# Patient Record
Sex: Female | Born: 1990 | Race: White | Hispanic: No | Marital: Married | Smoking: Former smoker
Health system: Southern US, Community
[De-identification: ages and names within clinical notes are randomized; demographics above are authoritative.]

## PROBLEM LIST (undated history)

## (undated) DIAGNOSIS — R569 Unspecified convulsions: Secondary | ICD-10-CM

## (undated) DIAGNOSIS — IMO0001 Reserved for inherently not codable concepts without codable children: Secondary | ICD-10-CM

## (undated) DIAGNOSIS — F1010 Alcohol abuse, uncomplicated: Secondary | ICD-10-CM

## (undated) DIAGNOSIS — F32A Depression, unspecified: Secondary | ICD-10-CM

## (undated) DIAGNOSIS — F1110 Opioid abuse, uncomplicated: Secondary | ICD-10-CM

## (undated) DIAGNOSIS — F419 Anxiety disorder, unspecified: Secondary | ICD-10-CM

## (undated) DIAGNOSIS — I639 Cerebral infarction, unspecified: Secondary | ICD-10-CM

## (undated) DIAGNOSIS — B1920 Unspecified viral hepatitis C without hepatic coma: Secondary | ICD-10-CM

## (undated) DIAGNOSIS — Q219 Congenital malformation of cardiac septum, unspecified: Secondary | ICD-10-CM

## (undated) HISTORY — PX: WRIST SURGERY: SHX841

## (undated) HISTORY — DX: Anxiety disorder, unspecified: F41.9

## (undated) HISTORY — DX: Depression, unspecified: F32.A

## (undated) HISTORY — PX: ADENOIDECTOMY: SUR15

---
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
## 2010-05-17 ENCOUNTER — Emergency Department (HOSPITAL_COMMUNITY)
Admission: EM | Admit: 2010-05-17 | Discharge: 2010-05-17 | Payer: Self-pay | Source: Home / Self Care | Admitting: Emergency Medicine

## 2010-08-03 ENCOUNTER — Emergency Department: Payer: Self-pay | Admitting: Emergency Medicine

## 2011-09-15 LAB — CBC
RBC: 4.7 10*6/uL (ref 3.80–5.20)
WBC: 8.4 10*3/uL (ref 3.6–11.0)

## 2011-09-15 LAB — COMPREHENSIVE METABOLIC PANEL
Anion Gap: 9 (ref 7–16)
Bilirubin,Total: 0.3 mg/dL (ref 0.2–1.0)
Chloride: 107 mmol/L (ref 98–107)
Co2: 25 mmol/L (ref 21–32)
Osmolality: 278 (ref 275–301)
Potassium: 3.6 mmol/L (ref 3.5–5.1)
SGOT(AST): 24 U/L (ref 15–37)
SGPT (ALT): 35 U/L

## 2011-09-15 LAB — DRUG SCREEN, URINE
Amphetamines, Ur Screen: NEGATIVE (ref ?–1000)
Benzodiazepine, Ur Scrn: POSITIVE (ref ?–200)
MDMA (Ecstasy)Ur Screen: NEGATIVE (ref ?–500)
Opiate, Ur Screen: NEGATIVE (ref ?–300)
Phencyclidine (PCP) Ur S: NEGATIVE (ref ?–25)
Tricyclic, Ur Screen: NEGATIVE (ref ?–1000)

## 2011-09-15 LAB — TSH: Thyroid Stimulating Horm: 0.44 u[IU]/mL — ABNORMAL LOW

## 2011-09-15 LAB — CBC WITH DIFFERENTIAL/PLATELET
Eosinophil #: 0.4 10*3/uL (ref 0.0–0.7)
Eosinophil %: 4.4 %
Lymphocyte #: 2.6 10*3/uL (ref 1.0–3.6)
Monocyte %: 5.3 %
Neutrophil #: 4.9 10*3/uL (ref 1.4–6.5)

## 2011-09-15 LAB — SALICYLATE LEVEL: Salicylates, Serum: 2.3 mg/dL

## 2011-09-15 LAB — ETHANOL: Ethanol %: 0.182 % — ABNORMAL HIGH (ref 0.000–0.080)

## 2011-09-15 LAB — PREGNANCY, URINE: Pregnancy Test, Urine: NEGATIVE m[IU]/mL

## 2011-09-16 ENCOUNTER — Inpatient Hospital Stay: Payer: Self-pay | Admitting: Psychiatry

## 2011-11-29 ENCOUNTER — Emergency Department: Payer: Self-pay | Admitting: Unknown Physician Specialty

## 2012-04-15 ENCOUNTER — Emergency Department: Payer: Self-pay | Admitting: Emergency Medicine

## 2012-04-15 ENCOUNTER — Ambulatory Visit: Payer: Self-pay | Admitting: Orthopedic Surgery

## 2012-04-24 ENCOUNTER — Ambulatory Visit: Payer: Self-pay | Admitting: Orthopedic Surgery

## 2013-04-06 ENCOUNTER — Emergency Department: Payer: Self-pay | Admitting: Emergency Medicine

## 2013-04-06 LAB — URINALYSIS, COMPLETE
Bilirubin,UR: NEGATIVE
Blood: NEGATIVE
Leukocyte Esterase: NEGATIVE

## 2013-04-06 LAB — PREGNANCY, URINE: Pregnancy Test, Urine: NEGATIVE m[IU]/mL

## 2013-04-06 LAB — BASIC METABOLIC PANEL
BUN: 7 mg/dL (ref 7–18)
EGFR (African American): >60
EGFR (Non-African Amer.): >60
Glucose: 82 mg/dL (ref 65–99)
Potassium: 3.2 mmol/L — ABNORMAL LOW (ref 3.5–5.1)

## 2013-04-06 LAB — DRUG SCREEN, URINE
Amphetamines, Ur Screen: NEGATIVE (ref ?–1000)
Benzodiazepine, Ur Scrn: POSITIVE (ref ?–200)
Cannabinoid 50 Ng, Ur ~~LOC~~: NEGATIVE (ref ?–50)
Methadone, Ur Screen: NEGATIVE (ref ?–300)

## 2013-04-06 LAB — CBC WITH DIFFERENTIAL/PLATELET
Basophil %: 0.3 %
Eosinophil %: 5.5 %
Lymphocyte %: 28.6 %
Monocyte %: 4.8 %
RDW: 15.6 % — ABNORMAL HIGH (ref 11.5–14.5)
WBC: 7.6 10*3/uL (ref 3.6–11.0)

## 2013-04-06 LAB — MAGNESIUM: Magnesium: 2 mg/dL

## 2013-05-27 ENCOUNTER — Emergency Department: Payer: Self-pay | Admitting: Emergency Medicine

## 2013-05-27 LAB — URINALYSIS, COMPLETE
BILIRUBIN, UR: NEGATIVE
Glucose,UR: NEGATIVE mg/dL (ref 0–75)
KETONE: NEGATIVE
LEUKOCYTE ESTERASE: NEGATIVE
Nitrite: NEGATIVE
PH: 6 (ref 4.5–8.0)
PROTEIN: NEGATIVE
SPECIFIC GRAVITY: 1.003 (ref 1.003–1.030)

## 2013-05-27 LAB — CBC
HCT: 40.9 % (ref 35.0–47.0)
HGB: 13.4 g/dL (ref 12.0–16.0)
MCH: 28.6 pg (ref 26.0–34.0)
MCHC: 32.9 g/dL (ref 32.0–36.0)
MCV: 87 fL (ref 80–100)
PLATELETS: 369 10*3/uL (ref 150–440)
RBC: 4.7 10*6/uL (ref 3.80–5.20)
RDW: 15.5 % — AB (ref 11.5–14.5)
WBC: 12.8 10*3/uL — AB (ref 3.6–11.0)

## 2013-05-27 LAB — ETHANOL
Ethanol %: 0.239 % — ABNORMAL HIGH (ref 0.000–0.080)
Ethanol: 239 mg/dL

## 2013-05-27 LAB — COMPREHENSIVE METABOLIC PANEL
ALT: 62 U/L (ref 12–78)
ANION GAP: 4 — AB (ref 7–16)
Albumin: 4 g/dL (ref 3.4–5.0)
Alkaline Phosphatase: 81 U/L
BUN: 10 mg/dL (ref 7–18)
Bilirubin,Total: 0.2 mg/dL (ref 0.2–1.0)
CALCIUM: 8.9 mg/dL (ref 8.5–10.1)
CREATININE: 0.73 mg/dL (ref 0.60–1.30)
Chloride: 108 mmol/L — ABNORMAL HIGH (ref 98–107)
Co2: 28 mmol/L (ref 21–32)
EGFR (African American): >60
EGFR (Non-African Amer.): >60
GLUCOSE: 94 mg/dL (ref 65–99)
OSMOLALITY: 278 (ref 275–301)
Potassium: 3.4 mmol/L — ABNORMAL LOW (ref 3.5–5.1)
SGOT(AST): 146 U/L — ABNORMAL HIGH (ref 15–37)
SODIUM: 140 mmol/L (ref 136–145)
TOTAL PROTEIN: 8.2 g/dL (ref 6.4–8.2)

## 2013-11-09 ENCOUNTER — Emergency Department: Payer: Self-pay | Admitting: Emergency Medicine

## 2013-11-09 LAB — COMPREHENSIVE METABOLIC PANEL
ALBUMIN: 4.3 g/dL (ref 3.4–5.0)
ALK PHOS: 77 U/L
ANION GAP: 13 (ref 7–16)
AST: 23 U/L (ref 15–37)
BILIRUBIN TOTAL: 0.5 mg/dL (ref 0.2–1.0)
BUN: 6 mg/dL — ABNORMAL LOW (ref 7–18)
CHLORIDE: 107 mmol/L (ref 98–107)
CREATININE: 0.83 mg/dL (ref 0.60–1.30)
Calcium, Total: 8.7 mg/dL (ref 8.5–10.1)
Co2: 22 mmol/L (ref 21–32)
EGFR (Non-African Amer.): >60
GLUCOSE: 78 mg/dL (ref 65–99)
OSMOLALITY: 280 (ref 275–301)
POTASSIUM: 2.9 mmol/L — AB (ref 3.5–5.1)
SGPT (ALT): 17 U/L
SODIUM: 142 mmol/L (ref 136–145)
Total Protein: 8.7 g/dL — ABNORMAL HIGH (ref 6.4–8.2)

## 2013-11-09 LAB — CBC
HCT: 44.4 % (ref 35.0–47.0)
HGB: 14.6 g/dL (ref 12.0–16.0)
MCH: 29.6 pg (ref 26.0–34.0)
MCHC: 32.8 g/dL (ref 32.0–36.0)
MCV: 90 fL (ref 80–100)
PLATELETS: 390 10*3/uL (ref 150–440)
RBC: 4.91 10*6/uL (ref 3.80–5.20)
RDW: 13.6 % (ref 11.5–14.5)
WBC: 9.9 10*3/uL (ref 3.6–11.0)

## 2013-11-09 LAB — URINALYSIS, COMPLETE
BACTERIA: NONE SEEN
BILIRUBIN, UR: NEGATIVE
GLUCOSE, UR: NEGATIVE mg/dL (ref 0–75)
KETONE: NEGATIVE
Leukocyte Esterase: NEGATIVE
Nitrite: NEGATIVE
PH: 6 (ref 4.5–8.0)
Protein: NEGATIVE
SPECIFIC GRAVITY: 1.009 (ref 1.003–1.030)

## 2013-11-09 LAB — DRUG SCREEN, URINE
Amphetamines, Ur Screen: NEGATIVE (ref ?–1000)
BENZODIAZEPINE, UR SCRN: NEGATIVE (ref ?–200)
Barbiturates, Ur Screen: NEGATIVE (ref ?–200)
CANNABINOID 50 NG, UR ~~LOC~~: NEGATIVE (ref ?–50)
COCAINE METABOLITE, UR ~~LOC~~: POSITIVE (ref ?–300)
MDMA (Ecstasy)Ur Screen: NEGATIVE (ref ?–500)
METHADONE, UR SCREEN: NEGATIVE (ref ?–300)
OPIATE, UR SCREEN: NEGATIVE (ref ?–300)
Phencyclidine (PCP) Ur S: NEGATIVE (ref ?–25)
Tricyclic, Ur Screen: NEGATIVE (ref ?–1000)

## 2013-11-09 LAB — ACETAMINOPHEN LEVEL: Acetaminophen: <2 ug/mL

## 2013-11-09 LAB — ETHANOL
ETHANOL %: 0.195 % — AB (ref 0.000–0.080)
Ethanol: 195 mg/dL

## 2013-11-09 LAB — TSH: THYROID STIMULATING HORM: 0.5 u[IU]/mL

## 2013-11-09 LAB — HEMOGLOBIN
HGB: 10.7 g/dL — ABNORMAL LOW (ref 12.0–16.0)
HGB: 10.4 g/dL — AB (ref 12.0–16.0)

## 2013-11-09 LAB — SALICYLATE LEVEL: Salicylates, Serum: 4.2 mg/dL — ABNORMAL HIGH

## 2013-11-09 LAB — PREGNANCY, URINE: PREGNANCY TEST, URINE: NEGATIVE m[IU]/mL

## 2013-11-19 DIAGNOSIS — F419 Anxiety disorder, unspecified: Secondary | ICD-10-CM | POA: Insufficient documentation

## 2013-11-19 DIAGNOSIS — F32A Depression, unspecified: Secondary | ICD-10-CM | POA: Insufficient documentation

## 2013-11-19 DIAGNOSIS — F329 Major depressive disorder, single episode, unspecified: Secondary | ICD-10-CM | POA: Insufficient documentation

## 2014-07-04 ENCOUNTER — Encounter (HOSPITAL_COMMUNITY): Payer: Self-pay | Admitting: Emergency Medicine

## 2014-07-04 ENCOUNTER — Emergency Department (HOSPITAL_COMMUNITY)
Admission: EM | Admit: 2014-07-04 | Discharge: 2014-07-04 | Disposition: A | Discharge status: Home / Self Care | Payer: Self-pay | Attending: Emergency Medicine | Admitting: Emergency Medicine

## 2014-07-04 DIAGNOSIS — Y9289 Other specified places as the place of occurrence of the external cause: Secondary | ICD-10-CM | POA: Insufficient documentation

## 2014-07-04 DIAGNOSIS — Z8673 Personal history of transient ischemic attack (TIA), and cerebral infarction without residual deficits: Secondary | ICD-10-CM | POA: Insufficient documentation

## 2014-07-04 DIAGNOSIS — T401X1A Poisoning by heroin, accidental (unintentional), initial encounter: Secondary | ICD-10-CM | POA: Insufficient documentation

## 2014-07-04 DIAGNOSIS — Y9389 Activity, other specified: Secondary | ICD-10-CM | POA: Insufficient documentation

## 2014-07-04 DIAGNOSIS — Z8669 Personal history of other diseases of the nervous system and sense organs: Secondary | ICD-10-CM | POA: Insufficient documentation

## 2014-07-04 DIAGNOSIS — Z720 Tobacco use: Secondary | ICD-10-CM | POA: Insufficient documentation

## 2014-07-04 DIAGNOSIS — Y998 Other external cause status: Secondary | ICD-10-CM | POA: Insufficient documentation

## 2014-07-04 HISTORY — DX: Cerebral infarction, unspecified: I63.9

## 2014-07-04 HISTORY — DX: Unspecified convulsions: R56.9

## 2014-07-04 NOTE — ED Provider Notes (Signed)
CSN: [id number]     Arrival date & time 07/04/14  1540 History   First MD Initiated Contact with Patient 07/04/14 1547     Chief Complaint  Patient presents with  . Drug Overdose     (Consider location/radiation/quality/duration/timing/severity/associated sxs/prior Treatment) Patient is a 24 y.o. female presenting with Overdose. The history is provided by the patient.  Drug Overdose Pertinent negatives include no chest pain, no abdominal pain and no headaches.  pt with hx substance abuse, presents via ems after accidental overdose on heroin. Pt indicates was at a friends, they called ems who found pt apneic, unresponsive. They gave patient narcan 2 mg iv, w prompt return of responsiveness. Pt currently alert, content, talking w family, no c/o. Pt states was accidental, and was in no way trying to harm or kill self. She acknowledges hx of depression, but denies any acute or abrupt worsening. Denies other ingestion. Denies etoh abuse. States recent physical health at baseline, no acute illness, fevers, or other symptoms. Pt denies pain or injury. No nv.      Past Medical History  Diagnosis Date  . Stroke   . Seizures    Past Surgical History  Procedure Laterality Date  . Wrist surgery    . Adenoidectomy     No family history on file. History  Substance Use Topics  . Smoking status: Current Every Day Smoker  . Smokeless tobacco: Not on file  . Alcohol Use: Yes   OB History    No data available     Review of Systems  Constitutional: Negative for fever and chills.  HENT: Negative for sore throat.   Eyes: Negative for redness.  Respiratory: Negative for cough.   Cardiovascular: Negative for chest pain.  Gastrointestinal: Negative for vomiting, abdominal pain and diarrhea.  Genitourinary: Negative for flank pain.  Musculoskeletal: Negative for back pain and neck pain.  Skin: Negative for rash.  Neurological: Negative for weakness, numbness and headaches.  Hematological:  Does not bruise/bleed easily.  Psychiatric/Behavioral: Negative for suicidal ideas.      Allergies  Review of patient's allergies indicates no known allergies.  Home Medications   Prior to Admission medications   Not on File   BP 121/85 mmHg  Pulse 89  Temp(Src) 98.5 F (36.9 C) (Oral)  Resp 15  Ht 5\' 1"  (1.549 m)  SpO2 97%  LMP  (LMP Unknown) Physical Exam  Constitutional: She is oriented to person, place, and time. She appears well-developed and well-nourished. No distress.  HENT:  Head: Atraumatic.  Eyes: Conjunctivae are normal. Pupils are equal, round, and reactive to light. No scleral icterus.  Neck: Normal range of motion. Neck supple. No tracheal deviation present.  Cardiovascular: Normal rate, regular rhythm, normal heart sounds and intact distal pulses.   Pulmonary/Chest: Effort normal and breath sounds normal. No respiratory distress. She exhibits no tenderness.  Abdominal: Soft. Normal appearance and bowel sounds are normal. She exhibits no distension. There is no tenderness.  Musculoskeletal: Normal range of motion. She exhibits no edema or tenderness.  CTLS spine, non tender, aligned, no step off.   Neurological: She is alert and oriented to person, place, and time.  Motor intact bil. Using phone, talking w family.   Skin: Skin is warm and dry. No rash noted. She is not diaphoretic.  Psychiatric: She has a normal mood and affect.  Normal mood/affect, denies SI.   Nursing note and vitals reviewed.   ED Course  Procedures (including critical care time) Labs Review  EKG Interpretation   Date/Time:  Friday July 04 2014 15:49:28 EDT Ventricular Rate:  88 PR Interval:  154 QRS Duration: 94 QT Interval:  380 QTC Calculation: 460 R Axis:   83 Text Interpretation:  Sinus rhythm No previous tracing Confirmed by Denton Lank   MD, Caryn Bee ([id number]) on 07/04/2014 4:02:09 PM      MDM   Iv ns. Monitor. Pulse ox.  Pt denies any recent physical c/o.  Pt  denies feeling acutely depressed or having any thoughts of self harm, states overdose was accidental. She also states she is currently in an outpatient substance abuse program ?[facility], and has a counselor/therapist through that program whom she can/will follow up with.  Pt denies needing inpatient tx, detox or counseling.   Family here w pt.   Pt continues to deny any c/o.  Pulse ox 99%. No increased wob. No nv.  Reviewed nursing notes and prior charts for additional history.   Approximately 2 hours post arrival, pt asymptomatic. Pulse ox 99%. Vitals normal. No resp distress or dyspnea. No nv. No c/o pain or other c/o.  Pt requests d/c to home. Family w pt.     Cathren Laine, MD 07/04/14 1726

## 2014-07-04 NOTE — ED Notes (Signed)
Per [facility], pt from home, took a normal hit of herion ([place] white) and was found by friends in respiratory arrest, blue, no pulse. CPR was initiated by her friends for roughly 5 minutes, upon EMS arrival pt was alert, given 2mg  narcan. Upon arrival to ED, pt is AAOX4, in NAD texting on her phone. Pt c/o sternal pain. Pt has hx of stroke and seizures after coming off herion. Per ems, [place] white heroin can cause recurrent symptoms 25min to 2 hours after use.

## 2014-07-04 NOTE — Discharge Instructions (Signed)
It was our pleasure to provide your ER care today - we hope that you feel better.  Do not use heroin - heroin use can cause you to stop breathing, resulting in death.  Follow up with your outpatient substance abuse program in the next 1-2 days.  Also see resource guide provided for additional community resources.    Also follow up with counselor and primary care doctor in coming week.   Return to ER right away if worse, new symptoms, trouble breathing, fevers, other concern.     Accidental Overdose A drug overdose occurs when a chemical substance (drug or medication) is used in amounts large enough to overcome a person. This may result in severe illness or death. This is a type of poisoning. Accidental overdoses of medications or other substances come from a variety of reasons. When this happens accidentally, it is often because the person taking the substance does not know enough about what they have taken. Drugs which commonly cause overdose deaths are alcohol, psychotropic medications (medications which affect the mind), pain medications, illegal drugs (street drugs) such as cocaine and heroin, and multiple drugs taken at the same time. It may result from careless behavior (such as over-indulging at a party). Other causes of overdose may include multiple drug use, a lapse in memory, or drug use after a period of no drug use.  Sometimes overdosing occurs because a person cannot remember if they have taken their medication.  A common unintentional overdose in young children involves multi-vitamins containing iron. Iron is a part of the hemoglobin molecule in blood. It is used to transport oxygen to living cells. When taken in small amounts, iron allows the body to restock hemoglobin. In large amounts, it causes problems in the body. If this overdose is not treated, it can lead to death. Never take medicines that show signs of tampering or do not seem quite right. Never take medicines in the dark  or in poor lighting. Read the label and check each dose of medicine before you take it. When adults are poisoned, it happens most often through carelessness or lack of information. Taking medicines in the dark or taking medicine prescribed for someone else to treat the same type of problem is a dangerous practice. SYMPTOMS  Symptoms of overdose depend on the medication and amount taken. They can vary from over-activity with stimulant over-dosage, to sleepiness from depressants such as alcohol, narcotics and tranquilizers. Confusion, dizziness, nausea and vomiting may be present. If problems are severe enough coma and death may result. DIAGNOSIS  Diagnosis and management are generally straightforward if the drug is known. Otherwise it is more difficult. At times, certain symptoms and signs exhibited by the patient, or blood tests, can reveal the drug in question.  TREATMENT  In an emergency department, most patients can be treated with supportive measures. Antidotes may be available if there has been an overdose of opioids or benzodiazepines. A rapid improvement will often occur if this is the cause of overdose. At home or away from medical care:  There may be no immediate problems or warning signs in children.  Not everything works well in all cases of poisoning.  Take immediate action. Poisons may act quickly.  If you think someone has swallowed medicine or a household product, and the person is unconscious, having seizures (convulsions), or is not breathing, immediately call for an ambulance. IF a person is conscious and appears to be doing OK but has swallowed a poison:  Do not wait  to see what effect the poison will have. Immediately call a poison control center (listed in the white pages of your telephone book under "Poison Control" or inside the front cover with other emergency numbers). Some poison control centers have TTY capability for the deaf. Check with your local center if you or  someone in your family requires this service.  Keep the container so you can read the label on the product for ingredients.  Describe what, when, and how much was taken and the age and condition of the person poisoned. Inform them if the person is vomiting, choking, drowsy, shows a change in color or temperature of skin, is conscious or unconscious, or is convulsing.  Do not cause vomiting unless instructed by medical personnel. Do not induce vomiting or force liquids into a person who is convulsing, unconscious, or very drowsy. Stay calm and in control.   Activated charcoal also is sometimes used in certain types of poisoning and you may wish to add a supply to your emergency medicines. It is available without a prescription. Call a poison control center before using this medication. PREVENTION  Thousands of children die every year from unintentional poisoning. This may be from household chemicals, poisoning from carbon monoxide in a car, taking their parent's medications, or simply taking a few iron pills or vitamins with iron. Poisoning comes from unexpected sources.  Store medicines out of the sight and reach of children, preferably in a locked cabinet. Do not keep medications in a food cabinet. Always store your medicines in a secure place. Get rid of expired medications.  If you have children living with you or have them as occasional guests, you should have child-resistant caps on your medicine containers. Keep everything out of reach. Child proof your home.  If you are called to the telephone or to answer the door while you are taking a medicine, take the container with you or put the medicine out of the reach of small children.  Do not take your medication in front of children. Do not tell your child how good a medication is and how good it is for them. They may get the idea it is more of a treat.  If you are an adult and have accidentally taken an overdose, you need to consider how  this happened and what can be done to prevent it from happening again. If this was from a street drug or alcohol, determine if there is a problem that needs addressing. If you are not sure a problems exists, it is easy to talk to a professional and ask them if they think you have a problem. It is better to handle this problem in this way before it happens again and has a much worse consequence. Document Released: 06/18/2004 Document Revised: 06/27/2011 Document Reviewed: 11/24/2008 [facility] Patient Information 2015 [place], [place]. This information is not intended to replace advice given to you by your health care provider. Make sure you discuss any questions you have with your health care provider.      Narcotic Overdose A narcotic overdose is the misuse or overuse of a narcotic drug. A narcotic overdose can make you pass out and stop breathing. If you are not treated right away, this can cause permanent brain damage or stop your heart. Medicine may be given to reverse the effects of an overdose. If so, this medicine may bring on withdrawal symptoms. The symptoms may be abdominal cramps, throwing up (vomiting), sweating, chills, and nervousness. Injecting narcotics can cause  more problems than just an overdose. AIDS, hepatitis, and other very serious infections are transmitted by sharing needles and syringes. If you decide to quit using, there are medicines which can help you through the withdrawal period. Trying to quit all at once on your own can be uncomfortable, but not life-threatening. Call your caregiver, Narcotics Anonymous, or any drug and alcohol treatment program for further help.  Document Released: 05/12/2004 Document Revised: 06/27/2011 Document Reviewed: 03/06/2009 [facility] Patient Information 2015 [place], [place]. This information is not intended to replace advice given to you by your health care provider. Make sure you discuss any questions you have with your health care  provider.   Opioid Use Disorder Opioid use disorder is a mental disorder. It is the continued nonmedical use of opioids in spite of risks to health and well-being. Misused opioids include the street drug heroin. They also include pain medicines such as morphine, hydrocodone, oxycodone, and fentanyl. Opioids are very addictive. People who misuse opioids get an exaggerated feeling of well-being. Opioid use disorder often disrupts activities at home, work, or school. It may cause mental or physical problems.  A family history of opioid use disorder puts you at higher risk of it. People with opioid use disorder often misuse other drugs or have mental illness such as depression, posttraumatic stress disorder, or antisocial personality disorder. They also are at risk of suicide and death from overdose. SIGNS AND SYMPTOMS  Signs and symptoms of opioid use disorder include:  Use of opioids in larger amounts or over a longer period than intended.  Unsuccessful attempts to cut down or control opioid use.  A lot of time spent obtaining, using, or recovering from the effects of opioids.  A strong desire or urge to use opioids (craving).  Continued use of opioids in spite of major problems at work, school, or home because of use.  Continued use of opioids in spite of relationship problems because of use.  Giving up or cutting down on important life activities because of opioid use.  Use of opioids over and over in situations when it is physically hazardous, such as driving a car.  Continued use of opioids in spite of a physical problem that is likely related to use. Physical problems can include:  Severe constipation.  Poor nutrition.  Infertility.  Tuberculosis.  Aspiration pneumonia.  Infections such as human immunodeficiency virus (HIV) and hepatitis (from injecting opioids).  Continued use of opioids in spite of a mental problem that is likely related to use. Mental problems can  include:  Depression.  Anxiety.  Hallucinations.  Sleep problems.  Loss of sexual function.  Need to use more and more opioids to get the same effect, or lessened effect over time with use of the same amount (tolerance).  Having withdrawal symptoms when opioid use is stopped, or using opioids to reduce or avoid withdrawal symptoms. Withdrawal symptoms include:  Depressed, anxious, or irritable mood.  Nausea, vomiting, diarrhea, or intestinal cramping.  Muscle aches or spasms.  Excessive tearing or runny nose.  Dilated pupils, sweating, or hairs standing on end.  Yawning.  Fever, raised blood pressure, or fast pulse.  Restlessness or trouble sleeping. This does not apply to people taking opioids for medical reasons only. DIAGNOSIS Opioid use disorder is diagnosed by your health care provider. You may be asked questions about your opioid use and and how it affects your life. A physical exam may be done. A drug screen may be ordered. You may be referred to a  mental health professional. The diagnosis of opioid use disorder requires at least two symptoms within 12 months. The type of opioid use disorder you have depends on the number of signs and symptoms you have. The type may be:  Mild. Two or three signs and symptoms.   Moderate. Four or five signs and symptoms.   Severe. Six or more signs and symptoms. TREATMENT  Treatment is usually provided by mental health professionals with training in substance use disorders.The following options are available:  Detoxification.This is the first step in treatment for withdrawal. It is medically supervised withdrawal with the use of medicines. These medicines lessen withdrawal symptoms. They also raise the chance of becoming opioid free.  Counseling, also known as talk therapy. Talk therapy addresses the reasons you use opioids. It also addresses ways to keep you from using again (relapse). The goals of talk therapy are to avoid  relapse by:  Identifying and avoiding triggers for use.  Finding healthy ways to cope with stress.  Learning how to handle cravings.  Support groups. Support groups provide emotional support, advice, and guidance.  A medicine that blocks opioid receptors in your brain. This medicine can reduce opioid cravings that lead to relapse. This medicine also blocks the desired opioid effect when relapse occurs.  Opioids that are taken by mouth in place of the misused opioid (opioid maintenance treatment). These medicines satisfy cravings but are safer than commonly misused opioids. This often is the best option for people who continue to relapse with other treatments. HOME CARE INSTRUCTIONS   Take medicines only as directed by your health care provider.  Check with your health care provider before starting new medicines.  Keep all follow-up visits as directed by your health care provider. SEEK MEDICAL CARE IF:  You are not able to take your medicines as directed.  Your symptoms get worse. SEEK IMMEDIATE MEDICAL CARE IF:  You have serious thoughts about hurting yourself or others.  You may have taken an overdose of opioids. FOR MORE INFORMATION  National Institute on Drug Abuse: http://www.price-smith.com/  Substance Abuse and Mental Health Services Administration: SkateOasis.com.pt Document Released: 01/30/2007 Document Revised: 08/19/2013 Document Reviewed: 04/17/2013 [facility] Patient Information 2015 [place], [place]. This information is not intended to replace advice given to you by your health care provider. Make sure you discuss any questions you have with your health care provider.     Emergency Department Resource Guide 1) Find a Doctor and Pay Out of Pocket Although you won't have to find out who is covered by your insurance plan, it is a good idea to ask around and get recommendations. You will then need to call the office and see if the doctor you have chosen will accept you as a new  patient and what types of options they offer for patients who are self-pay. Some doctors offer discounts or will set up payment plans for their patients who do not have insurance, but you will need to ask so you aren't surprised when you get to your appointment.  2) Contact Your Local Health Department Not all health departments have doctors that can see patients for sick visits, but many do, so it is worth a call to see if yours does. If you don't know where your local health department is, you can check in your phone book. The CDC also has a tool to help you locate your state's health department, and many state websites also have listings of all of their local health departments.  3) Find  a Walk-in Clinic If your illness is not likely to be very severe or complicated, you may want to try a walk in clinic. These are popping up all over the country in pharmacies, drugstores, and shopping centers. They're usually staffed by nurse practitioners or physician assistants that have been trained to treat common illnesses and complaints. They're usually fairly quick and inexpensive. However, if you have serious medical issues or chronic medical problems, these are probably not your best option.  No Primary Care Doctor: - Call Health Connect at  [phone] - they can help you locate a primary care doctor that  accepts your insurance, provides certain services, etc. - Physician Referral Service- [phone]  Chronic Pain Problems: Organization         Address  Phone   Notes  [facility]  [phone] Patients need to be referred by their primary care doctor.   Medication Assistance: Organization         Address  Phone   Notes  [facility] Medication [facility] [address] [place] [place], [place] [address] [phone] --Must be a resident of [facility] -- Must have NO insurance coverage whatsoever (no Medicaid/ Medicare, etc.) -- The pt. MUST have a primary  care doctor that directs their care regularly and follows them in the community   [facility]ist  [phone]   [employer]  [phone]    Agencies that provide inexpensive medical care: Organization         Address  Phone   Notes  [facility]  [phone]   [facility]    [phone]   [facility] [address] [place], [place] [address] [phone]   [facility] [address] [place] [phone]   Planned Parenthood    [phone]   [facility]    [phone]   [facility]  [address], [place] Phone:  [phone], Fax:  [phone] Hours of Operation:  9 am - 6 pm, M-F.  Also accepts Medicaid/Medicare and self-pay.  [facility]  [address], [place] Phone: [phone], Fax: [phone]. Hours of Operation:  8:30 am - 5:30 pm, M-F.  Also accepts Medicaid and self-pay.  [facility] [place] [address] [place] Phone: [phone]   [facility] [address] Natasha Bence [place], [place] [phone], Ext. [phone] Mondays & Thursdays: 7-9 AM.  First 15 patients are seen on a first come, first serve basis.    Medicaid-accepting [facility] Providers:  Organization         Address  Phone   Notes  [facility] [address], [place] [phone] Also accepts self-pay patients.  [facility] [address] Laurell Josephs [place], [place]  [phone]   [facility] [address], [place] [phone]   [facility] [address], [place] [phone]   Renaye Rakers [address], [place]   [phone] Only accepts [place] Access [place] patients after they have their name applied to their card.   Self-Pay (no insurance) in [facility]:  Organization         Address  Phone   Notes  Sickle Cell Patients, [facility] [address]  [place], [place] [phone]   [facility]gent [facility] [place] [phone]   [facility] [place]  [place], [place], [place] [phone]   [facility]/Dr. Osei-Bonsu  [address], [place] or [address], [place] [phone] Phone number for both [place] and [place] locations is the same.  [facility] [address] [place] [phone]   [facility] [address], [place] or [address] [phone]   [facility] [address], [place] [phone], phone; [phone], fax Sees patients 1st and 3rd Saturday of every month.  Must not qualify for public or private insurance (i.e. Medicaid, Medicare, [place] Health Choice, Veterans' Benefits)  Household income should be no more than 200% of the poverty level The clinic cannot treat you if you are pregnant or think you are pregnant  Sexually transmitted diseases are not treated at the clinic.    Dental Care: Organization         Address  Phone  Notes  [facility] Department of [facility] [place] [phone] Accepts children up to age 72 who are enrolled in [place] or [place]; pregnant women with a Medicaid card; and children who have applied for Medicaid or [employer] Health Choice, but were declined, whose parents can pay a reduced fee at time of service.  [facility] [place] [facility] Department of [facility]  [address], [place] [phone] Accepts children up to age 81 who are enrolled in [place] or [place]; pregnant women with a Medicaid card; and children who have applied for Medicaid or [place] Health Choice, but were declined, whose parents can  pay a reduced fee at time of service.  [place] Adult [facility] Access PROGRAM  [place] [phone] Patients are seen by appointment only. Walk-ins are not accepted. [place] will see patients 36 years of age and older. Monday - Tuesday (8am-5pm) Most Wednesdays (8:30-5pm) $30 per visit, cash only  [facility] Adult [facility]  [address], [facility] [phone] Patients are seen by appointment only. Walk-ins are not accepted. [place] will see patients 110 years of age and older. One Wednesday Evening (Monthly: Volunteer Based).  $30 per visit, cash only  [place]  [phone] for adults; Children under age 36, call Graduate Pediatric Dentistry at [phone]. Children aged 42-14, please call [phone] to request a pediatric application.  Dental services are provided in all areas of dental care including fillings, crowns and bridges, complete and partial dentures, implants, gum treatment, root canals, and extractions. Preventive care is also provided. Treatment is provided to both adults and children. Patients are selected via a lottery and there is often a waiting list.   [facility] [address], [place]  [phone] [email or web address]   [facility] [address] [place], [place] [phone], Ext. [phone] Second and Fourth Thursday of each month, opens at 6:30 AM; Clinic ends at 9 AM.  Patients are seen on a first-come first-served basis, and a limited number are seen during each clinic.   [facility]  [address] Hillard Danker [place], [place] [phone]   Eligibility Requirements You must have lived in [place], [place], or [place]  counties for at least the last three months.   You cannot be eligible for state or federal sponsored [employer], including [employer], [place], or [employer].   You generally cannot be eligible for healthcare  insurance through your employer.    How to apply: Eligibility screenings are held every Tuesday and Wednesday afternoon from 1:00 pm until 4:00 pm. You do not need an appointment for the interview!  [facility] [address] [place], [place]   [place]  [place] Department  [place]  [phone]    Behavioral Health Resources in the Community: Intensive Outpatient Programs Organization         Address  Phone  Notes  Shiloh Lyons. [address], [place], [place] [phone]   [facility] Outpatient [address] [place], [place]   [facility] [address] [place], [place]   [place] [address]  [place], [place] or [phone]   Substance Abuse Resources Organization         Address  Phone  Notes  Al[facility] Drug Services  [phone]   [place]  [phone]   [facility] [place]   Chinita Pester  [phone]   Residential & Outpatient Substance Abuse Program  [phone]   Psychological Services Organization         Address  Phone  Notes  [facility] [place]  [place]  [phone]   [place] [address] [place] or [phone]    Mobile Crisis Teams Organization         Address  Phone  Notes  [facility], Mobile Crisis Care Unit  [phone]   [facility]  [address] [place], [place]   [place] [address] [place] [phone]    Self-Help/Support Groups Organization         Address  Phone             Notes  [place]. of [place] - variety of support groups  [place] Call for more information  Narcotics Anonymous (NA),  [facility] [address], [employer] [place]  2 meetings at this location   Special educational needs teacher         Address  Phone  Notes  [facility] [place],    [place]  [phone]   [facility]  [address], [place] [phone], [place], [place]   [place], [place] [phone] Admissions: 8am-3pm M-F  Incentives Substance [place] [address],    [place], [place] [phone]   [facility] [address] [place], [place], [place]   [facility] [address]  [place], [place]   [facility] - Intensive Outpatient [address] Kristeen Mans [address], [place], [place]   [facility] [place].,  [place], [place] or [phone]   [facility] [address], [place], [place] Accepts Medicaid  [facility] [address]  [place] [phone] Substance Abuse/Addiction Treatment   [facility] Resources Organization         Address  Phone  Notes  Lexicographer Services  (  [phone]) [phone]   Angie FavaJulie Brannon, PhD [address], [address] [place], [place]   [phone]) [phone] or [phone]) [phone]   [facility]   [address] [place], [place] [phone](336) [phone]   [facility] [address], [place], [place] [phone]) [phone] Insurance/Medicaid/sponsorship through [facility]  [facility] [address]                                    [place], [place] [phone](336) [phone] Therapy/tele-psych/case  [facility] [address]   [place], [place] [phone]) [phone]    Dr. Lolly MustacheArfeen  [phone](336) [phone]   [facility]  [employer] [facility] 1) [address], [place] 2) [address], [place] 3)  [address], [place] [phone]) [phone]  [phone]) [phone]    [facility] Child Abuse Hotline [phone](336) [phone] or [phone]) [phone] (After Hours)

## 2014-08-05 NOTE — Consult Note (Signed)
DATE OF BIRTH:  Aug 07, 1990  DATE OF CONSULTATION:  04/15/2012  CONSULTING PHYSICIAN:  Danelle Earthlyobin T. Beryle Bagsby, MD  HISTORY OF PRESENT ILLNESS:  The patient is a 24 year old female with a complicated medical history to include CVA, seizures, alcohol/drug abuse and depression, who sustained a fall on her outstretched hand. She was noted to be in pain with swelling and deformity about her right wrist. She presented to the Emergency Room for evaluation.   PAST MEDICAL HISTORY:  As above.  CURRENT MEDICATIONS:  Citalopram.  ALLERGIES:  She has an allergy to TYLENOL.  REVIEW OF SYSTEMS:  Negative for fevers, chills, nausea, vomiting or chest pain.   FAMILY HISTORY:  Noncontributory.  PHYSICAL EXAMINATION:  The patient is an alert and oriented, right-hand dominant female with significant swelling and dorsal deformity about her right distal radius. She does have numbness in the ring and small fingers, but states this has been chronic in nature. She is able to move all of her fingers appropriately and has sensation otherwise intact in the median and radial nerve distributions, as well as slightly diminished in the ulnar distribution as mentioned above. She has palpable right ulnar pulses and brisk capillary refill to all digits.  RADIOGRAPHS:  Demonstrate a distal radial fracture with dorsal angulation displacement.  ASSESSMENT:  This is a 24 year old female with a right distal radius fracture.  PLAN:  The patient was consented for conscious sedation and closed reduction. She underwent closed reduction maneuvering and sugar tong splint placement. Post reduction radiographs demonstrated overall improved alignment and restoration of her radial height, angulation and volar tilt. She will follow up in 1 week's time for repeat radiographs and decision whether to continue with nonoperative management versus possible open reduction internal fixation.    ____________________________ Danelle Earthlyobin T. Sunita Demond,  MD tte:ms D: 04/15/2012 05:42:57 ET T: 04/15/2012 20:15:01 ET JOB#: [id number]  cc: Danelle Earthlyobin T. Rotunda Worden, MD, <Dictator> Danelle EarthlyBIN T Shreeya Recendiz MD ELECTRONICALLY SIGNED 04/16/2012 4:38

## 2014-08-08 NOTE — Op Note (Signed)
PATIENT NAME:  Peggy Lewis, Peggy Lewis MR#:  [id number] DATE OF BIRTH:  11-01-1990  DATE OF PROCEDURE:  04/24/2012  PREOPERATIVE DIAGNOSIS: Right distal radius fracture, displaced.   POSTOPERATIVE DIAGNOSIS: Right distal radius fracture, displaced.   PROCEDURE: Open reduction internal fixation right distal radius.   ANESTHESIA: General.  SURGEON: Leitha SchullerMichael Lewis. Shawon Denzer, M.D.   DESCRIPTION OF PROCEDURE: The patient was brought to the operating room and after adequate anesthesia was obtained, the right arm was prepped and draped in the usual sterile fashion with a tourniquet applied to the upper arm. After patient identification and timeout procedures were completed, the tourniquet was raised to 250 mmHg and fingertrap traction was applied through traction at the end of the table. A volar incision was made over the distal radius in line with the flexor carpi radialis tendon. The tendon was identified, retracted radially and the deep tendon sheath incised. The pronator was then elevated off the distal radius and the fracture site exposed. With near anatomic alignment obtained, the plate was a short narrow DVR plate was applied and the distal peg holes was filled using standard technique, drilling, measuring, and placing the smooth pegs. The plate was then brought down to the shaft using three 10 mm cortical screws. AP and lateral imaging showed near anatomic alignment with restoration of length, radial tilt, and most of her volar tilt. The wound was thoroughly irrigated. The tourniquet was let down. Hemostasis was checked with electrocautery. There was minimal bleeding. The wound was closed with 2-0 Vicryl subcutaneously and 4-0 nylon for the skin. A sterile dressing of Xeroform, 4 x 4's, Webril, and a volar splint were applied followed by an Ace wrap. The patient was sent to the recovery room in stable condition.   ESTIMATED BLOOD LOSS: Minimal.   COMPLICATIONS: None.   SPECIMEN: None.   TOURNIQUET TIME: 33 minutes  at 250 mmHg.  IMPLANT: [employer] short narrow DVR plate with multiple pegs and cortical screws.   ____________________________ Leitha SchullerMichael Lewis. Nakoma Gotwalt, MD mjm:aw D: 04/24/2012 23:14:55 ET T: 04/25/2012 06:12:41 ET JOB#: [id number]  cc: Leitha SchullerMichael Lewis. Layth Cerezo, MD, <Dictator> Leitha SchullerMICHAEL Lewis Bannon Giammarco MD ELECTRONICALLY SIGNED 04/25/2012 8:30

## 2014-08-09 NOTE — Consult Note (Signed)
PATIENT NAME:  Peggy Lewis, Peggy Lewis MR#:  [id number] DATE OF BIRTH:  1990-11-02  DATE OF CONSULTATION:  11/09/2013  CONSULTING PHYSICIAN:  Lunabelle Oatley K. Ilias Stcharles, MD  SEX: Female.  RACE: White.  SUBJECTIVE: The patient [facility]. The patient is a 24 year old white female who is not employed at this time last worked about 4 months ago. 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 The patient reports that she got depressed after her partner of 8 years is found cheating on her for the past 3 months. She tried to cut herself and she saw the bleeding from her arm and she got scared and she told her neighbors who called help and she was brought here on help. The patient was positive for cocaine and she reports that she had been using heroin for several months and has been sober since February 2015 and when she came to know that her partner of 8 years was cheating she abused cocaine by snorting.   PAST PSYCHIATRIC HISTORY: History of overdose on pills in 2013, Klonopin and aspirin to get a high.  MENTAL STATUS EXAMINATION: Alert and oriented to place, person, time. Calm, pleasant, and cooperative. No agitation. Affect is neutral, mood stable. Denies feeling depressed. She feels ashamed that she did what she did and impulse control and she realizes this was wrong and she wants to get help for the same. No psychosis. Denies auditory or visual hallucinations or paranoid thinking. Memory is intact. Cognition is intact. General knowledge of information is fair. Contracts for safety and is eager to go home and get help on outpatient basis for her problem and requests that she be discharged so that she can get help from counseling from outpatient mental health clinic like [facility].   IMPRESSION: Major depression with impulse control problems after she found out that her partner of 8 years has  been cheating for 3 years. Cocaine abuse, snorted, after she came to know her partner was cheating. 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  PLAN: We will discontinue IVC and discharge patient and she will keep up her appointments in the community and get help as recommended.   ____________________________ Jannet MantisSurya K. Guss Bundehalla, MD skc:lt D: 11/09/2013 19:02:49 ET T: 11/09/2013 22:08:25 ET JOB#: [id number]  cc: Monika SalkSurya K. Guss Bundehalla, MD, <Dictator> Beau FannySURYA K Cyncere Sontag MD ELECTRONICALLY SIGNED 11/10/2013 19:17
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
## 2014-08-10 NOTE — Consult Note (Signed)
Brief Consult Note: Diagnosis: alcohol dep.   Patient was seen by consultant.   Recommend further assessment or treatment.   Orders entered.   Comments: Psychiatry: Patient seen.Binge on alcohol and klonopin. Admit for detox and stabilization. See H&P for full eval. Orders done and patient notified. She in under IVC.  Electronic Signatures: Audery Amellapacs, John T (MD)  (Signed 31-May-13 11:31)  Authored: Brief Consult Note   Last Updated: 31-May-13 11:31 by Audery Amellapacs, John T (MD)

## 2014-08-10 NOTE — Discharge Summary (Signed)
PATIENT NAME:  Peggy Lewis, Peggy Lewis MR#:  [id number] DATE OF BIRTH:  [birth year]  DATE OF ADMISSION:  09/16/2011 DATE OF DISCHARGE:  09/20/2011  HOSPITAL COURSE: See dictated History and Physical for details of admission. This 24 year old woman with a past history of alcohol abuse was admitted to the hospital on involuntary petition after being found by her family passed out intoxicated and after having made an overdose attempt. The patient had overdosed on alcohol and clonazepam. From her behavior around this incident, it was reasonable to infer that this had actually been a suicide attempt, although the patient has a significant history of drug and alcohol abuse.  The patient had just gotten out of prison and was already having conflicts with her family. In the hospital the patient was treated for alcohol withdrawal and tolerated this well with no seizures or delirium tremens. She was engaged in group therapy around alcohol use and mood disorder. The patient initially was resistant to showing insight into her substance abuse problem, but became more insightful and cooperative during her hospital stay. She did not engage in any dangerous or aggressive or suicidal behavior and denied suicidal ideation. She was treated with citalopram eventually titrated to a dose of 40 mg a day. By the time of discharge the patient was calm, physically stable, reporting a better mood, and showing an upbeat affect. She was able to endorse some responsibility for her substance abuse problem and was agreeable to follow-up treatment in the community. She was planning to go back and stay with her family or a close friend.   DISCHARGE MEDICATIONS: Celexa 40 mg per day.   LABORATORY RESULTS: Pregnancy test was negative. Drug screen was positive for benzodiazepines only. TSH low at 0.44. CBC within normal limits. Alcohol level was 182. Chemistry panel showed a slightly low creatinine and BUN, otherwise no significant abnormalities.   MENTAL  STATUS EXAM AT DISCHARGE: Neatly dressed and groomed. Good eye contact. Cooperative with the exam. Affect still a little bit constricted but not sad or tearful. Eye contact good. Psychomotor activity normal. Appears to be cognitively intact. Denies any suicidal or homicidal ideation. Reports her mood as being good. Denies any hallucinations. No sign of delusional or bizarre thinking. Improved judgment and insight.   DISPOSITION: Discharged back home to her family. Follow up in the community with [facility] for substance abuse and mood treatment.   DIAGNOSIS PRINCIPLE AND PRIMARY:  AXIS I: Alcohol dependence.   SECONDARY DIAGNOSES:  AXIS I:  1. Benzodiazepine dependence.  2. Depression, not otherwise specified.   AXIS II: Deferred.   AXIS III: Alcohol withdrawal, resolved.   AXIS IV: Severe from recent imprisonment, limited social options, difficulty interacting with her family.   AXIS V: Functioning at time of discharge: 55.     ____________________________ Audery AmelJohn T. Clapacs, MD jtc:bjt D: 10/04/2011 12:44:10 ET T: 10/04/2011 13:01:25 ET JOB#: [id number]  cc: Audery AmelJohn T. Clapacs, MD, <Dictator> Audery AmelJOHN T CLAPACS MD ELECTRONICALLY SIGNED 10/04/2011 15:39

## 2014-10-16 ENCOUNTER — Inpatient Hospital Stay
Admission: EM | Admit: 2014-10-16 | Discharge: 2014-10-17 | DRG: 443 | Disposition: A | Discharge status: Home / Self Care | Payer: Self-pay | Attending: Specialist | Admitting: Specialist

## 2014-10-16 ENCOUNTER — Emergency Department: Payer: Self-pay

## 2014-10-16 ENCOUNTER — Encounter: Payer: Self-pay | Admitting: Emergency Medicine

## 2014-10-16 DIAGNOSIS — F17210 Nicotine dependence, cigarettes, uncomplicated: Secondary | ICD-10-CM | POA: Diagnosis present

## 2014-10-16 DIAGNOSIS — R1013 Epigastric pain: Secondary | ICD-10-CM

## 2014-10-16 DIAGNOSIS — F1110 Opioid abuse, uncomplicated: Secondary | ICD-10-CM | POA: Diagnosis present

## 2014-10-16 DIAGNOSIS — E860 Dehydration: Secondary | ICD-10-CM | POA: Diagnosis present

## 2014-10-16 DIAGNOSIS — K2970 Gastritis, unspecified, without bleeding: Secondary | ICD-10-CM | POA: Diagnosis present

## 2014-10-16 DIAGNOSIS — Z8673 Personal history of transient ischemic attack (TIA), and cerebral infarction without residual deficits: Secondary | ICD-10-CM

## 2014-10-16 DIAGNOSIS — K5900 Constipation, unspecified: Secondary | ICD-10-CM | POA: Diagnosis present

## 2014-10-16 DIAGNOSIS — B179 Acute viral hepatitis, unspecified: Secondary | ICD-10-CM | POA: Insufficient documentation

## 2014-10-16 DIAGNOSIS — B1710 Acute hepatitis C without hepatic coma: Principal | ICD-10-CM | POA: Diagnosis present

## 2014-10-16 DIAGNOSIS — F329 Major depressive disorder, single episode, unspecified: Secondary | ICD-10-CM | POA: Diagnosis present

## 2014-10-16 DIAGNOSIS — Q219 Congenital malformation of cardiac septum, unspecified: Secondary | ICD-10-CM

## 2014-10-16 DIAGNOSIS — K30 Functional dyspepsia: Secondary | ICD-10-CM | POA: Diagnosis present

## 2014-10-16 DIAGNOSIS — R569 Unspecified convulsions: Secondary | ICD-10-CM | POA: Diagnosis present

## 2014-10-16 DIAGNOSIS — K759 Inflammatory liver disease, unspecified: Secondary | ICD-10-CM | POA: Diagnosis present

## 2014-10-16 DIAGNOSIS — F1010 Alcohol abuse, uncomplicated: Secondary | ICD-10-CM | POA: Diagnosis present

## 2014-10-16 HISTORY — DX: Alcohol abuse, uncomplicated: F10.10

## 2014-10-16 HISTORY — DX: Opioid abuse, uncomplicated: F11.10

## 2014-10-16 HISTORY — DX: Congenital malformation of cardiac septum, unspecified: Q21.9

## 2014-10-16 HISTORY — DX: Reserved for inherently not codable concepts without codable children: IMO0001

## 2014-10-16 LAB — URINE DRUG SCREEN, QUALITATIVE (ARMC ONLY)
AMPHETAMINES, UR SCREEN: NOT DETECTED
Barbiturates, Ur Screen: NOT DETECTED
Benzodiazepine, Ur Scrn: POSITIVE — AB
CANNABINOID 50 NG, UR ~~LOC~~: POSITIVE — AB
Cocaine Metabolite,Ur ~~LOC~~: NOT DETECTED
MDMA (Ecstasy)Ur Screen: NOT DETECTED
Methadone Scn, Ur: NOT DETECTED
Opiate, Ur Screen: POSITIVE — AB
Phencyclidine (PCP) Ur S: NOT DETECTED
Tricyclic, Ur Screen: NOT DETECTED

## 2014-10-16 LAB — URINALYSIS COMPLETE WITH MICROSCOPIC (ARMC ONLY)
GLUCOSE, UA: NEGATIVE mg/dL
HGB URINE DIPSTICK: NEGATIVE
NITRITE: NEGATIVE
Protein, ur: 100 mg/dL — AB
SPECIFIC GRAVITY, URINE: 1.029 (ref 1.005–1.030)
pH: 5 (ref 5.0–8.0)

## 2014-10-16 LAB — COMPREHENSIVE METABOLIC PANEL
ALBUMIN: 3.4 g/dL — AB (ref 3.5–5.0)
ALT: 911 U/L — ABNORMAL HIGH (ref 14–54)
AST: 612 U/L — AB (ref 15–41)
Alkaline Phosphatase: 312 U/L — ABNORMAL HIGH (ref 38–126)
Anion gap: 10 (ref 5–15)
BILIRUBIN TOTAL: 5.6 mg/dL — AB (ref 0.3–1.2)
BUN: 8 mg/dL (ref 6–20)
CALCIUM: 8.5 mg/dL — AB (ref 8.9–10.3)
CHLORIDE: 103 mmol/L (ref 101–111)
CO2: 25 mmol/L (ref 22–32)
Creatinine, Ser: 0.55 mg/dL (ref 0.44–1.00)
GFR calc non Af Amer: >60 mL/min (ref >60)
GLUCOSE: 97 mg/dL (ref 65–99)
POTASSIUM: 3.6 mmol/L (ref 3.5–5.1)
Sodium: 138 mmol/L (ref 135–145)
TOTAL PROTEIN: 6.5 g/dL (ref 6.5–8.1)

## 2014-10-16 LAB — PREGNANCY, URINE: PREG TEST UR: NEGATIVE

## 2014-10-16 LAB — LIPASE, BLOOD: Lipase: 47 U/L (ref 22–51)

## 2014-10-16 LAB — ACETAMINOPHEN LEVEL

## 2014-10-16 LAB — CBC WITH DIFFERENTIAL/PLATELET
BASOS PCT: 1 %
Basophils Absolute: 0.1 10*3/uL (ref 0–0.1)
Eosinophils Absolute: 0.5 10*3/uL (ref 0–0.7)
Eosinophils Relative: 7 %
HCT: 38.8 % (ref 35.0–47.0)
HEMOGLOBIN: 12.4 g/dL (ref 12.0–16.0)
LYMPHS PCT: 40 %
Lymphs Abs: 2.9 10*3/uL (ref 1.0–3.6)
MCH: 25.5 pg — AB (ref 26.0–34.0)
MCHC: 32 g/dL (ref 32.0–36.0)
MCV: 79.6 fL — ABNORMAL LOW (ref 80.0–100.0)
Monocytes Absolute: 0.5 10*3/uL (ref 0.2–0.9)
Monocytes Relative: 7 %
NEUTROS ABS: 3.3 10*3/uL (ref 1.4–6.5)
Neutrophils Relative %: 45 %
PLATELETS: 155 10*3/uL (ref 150–440)
RBC: 4.88 MIL/uL (ref 3.80–5.20)
RDW: 17.8 % — ABNORMAL HIGH (ref 11.5–14.5)
WBC: 7.3 10*3/uL (ref 3.6–11.0)

## 2014-10-16 LAB — PROTIME-INR
INR: 1.01
Prothrombin Time: 13.5 seconds (ref 11.4–15.0)

## 2014-10-16 LAB — BILIRUBIN, DIRECT: Bilirubin, Direct: 3.9 mg/dL — ABNORMAL HIGH (ref 0.1–0.5)

## 2014-10-16 LAB — POCT PREGNANCY, URINE: PREG TEST UR: NEGATIVE

## 2014-10-16 LAB — RAPID HIV SCREEN (HIV 1/2 AB+AG)
HIV 1/2 ANTIBODIES: NONREACTIVE
HIV-1 P24 Antigen - HIV24: NONREACTIVE

## 2014-10-16 MED ORDER — MORPHINE SULFATE 2 MG/ML IJ SOLN
2.0000 mg | Freq: Once | INTRAMUSCULAR | Status: AC
  Administered 2014-10-16: 2 mg via INTRAVENOUS

## 2014-10-16 MED ORDER — SODIUM CHLORIDE 0.9 % IV BOLUS (SEPSIS): 1000.0000 mL | Freq: Once | INTRAVENOUS | Status: DC

## 2014-10-16 MED ORDER — IOHEXOL 240 MG/ML SOLN
25.0000 mL | INTRAMUSCULAR | Status: DC
  Administered 2014-10-16: 25 mL via ORAL
  Filled 2014-10-16: qty 25

## 2014-10-16 MED ORDER — NICOTINE 21 MG/24HR TD PT24
21.0000 mg | MEDICATED_PATCH | Freq: Every day | TRANSDERMAL | Status: DC
  Administered 2014-10-16 – 2014-10-17 (×2): 21 mg via TRANSDERMAL
  Filled 2014-10-16 (×2): qty 1

## 2014-10-16 MED ORDER — NAPROXEN 500 MG PO TABS
ORAL_TABLET | ORAL | Status: AC
  Administered 2014-10-16: 500 mg via ORAL
  Filled 2014-10-16: qty 1

## 2014-10-16 MED ORDER — MORPHINE SULFATE 2 MG/ML IJ SOLN
2.0000 mg | Freq: Once | INTRAMUSCULAR | Status: AC
  Administered 2014-10-16: 2 mg via INTRAVENOUS
  Administered 2014-10-17: 1 mg via INTRAVENOUS

## 2014-10-16 MED ORDER — QUETIAPINE FUMARATE 100 MG PO TABS
100.0000 mg | ORAL_TABLET | Freq: Every day | ORAL | Status: DC
  Administered 2014-10-17: 100 mg via ORAL
  Filled 2014-10-16 (×2): qty 1

## 2014-10-16 MED ORDER — NAPROXEN 500 MG PO TABS
500.0000 mg | ORAL_TABLET | Freq: Once | ORAL | Status: AC
  Administered 2014-10-16: 500 mg via ORAL

## 2014-10-16 MED ORDER — SODIUM CHLORIDE 0.9 % IV BOLUS (SEPSIS)
1000.0000 mL | Freq: Once | INTRAVENOUS | Status: AC
  Administered 2014-10-16: 1000 mL via INTRAVENOUS

## 2014-10-16 MED ORDER — NICOTINE 14 MG/24HR TD PT24
MEDICATED_PATCH | TRANSDERMAL | Status: AC
Start: 2014-10-16 — End: 2014-10-17
  Administered 2014-10-16: 14 mg via TRANSDERMAL
  Filled 2014-10-16: qty 1

## 2014-10-16 MED ORDER — MORPHINE SULFATE 2 MG/ML IJ SOLN
INTRAMUSCULAR | Status: AC
  Filled 2014-10-16: qty 1

## 2014-10-16 MED ORDER — MORPHINE SULFATE 2 MG/ML IJ SOLN
1.0000 mg | INTRAMUSCULAR | Status: DC | PRN
  Administered 2014-10-16 – 2014-10-17 (×3): 1 mg via INTRAVENOUS
  Filled 2014-10-16 (×2): qty 1

## 2014-10-16 MED ORDER — OXYCODONE HCL 5 MG PO TABS
5.0000 mg | ORAL_TABLET | ORAL | Status: DC | PRN
  Administered 2014-10-16 – 2014-10-17 (×4): 5 mg via ORAL
  Filled 2014-10-16 (×4): qty 1

## 2014-10-16 MED ORDER — SODIUM CHLORIDE 0.9 % IV SOLN
INTRAVENOUS | Status: DC
  Administered 2014-10-17 (×2): via INTRAVENOUS

## 2014-10-16 MED ORDER — QUETIAPINE FUMARATE ER 200 MG PO TB24
200.0000 mg | ORAL_TABLET | Freq: Every day | ORAL | Status: DC
  Filled 2014-10-16 (×2): qty 1

## 2014-10-16 MED ORDER — IBUPROFEN 400 MG PO TABS: 400.0000 mg | ORAL_TABLET | Freq: Four times a day (QID) | ORAL | Status: DC | PRN

## 2014-10-16 MED ORDER — ONDANSETRON 4 MG PO TBDP
4.0000 mg | ORAL_TABLET | Freq: Once | ORAL | Status: AC
  Administered 2014-10-16: 4 mg via ORAL

## 2014-10-16 MED ORDER — ENOXAPARIN SODIUM 40 MG/0.4ML ~~LOC~~ SOLN
40.0000 mg | SUBCUTANEOUS | Status: DC
  Filled 2014-10-16: qty 0.4

## 2014-10-16 MED ORDER — GI COCKTAIL ~~LOC~~
30.0000 mL | Freq: Once | ORAL | Status: AC
  Administered 2014-10-16: 30 mL via ORAL

## 2014-10-16 MED ORDER — NICOTINE 14 MG/24HR TD PT24
14.0000 mg | MEDICATED_PATCH | Freq: Once | TRANSDERMAL | Status: DC
  Administered 2014-10-16: 14 mg via TRANSDERMAL
  Filled 2014-10-16: qty 1

## 2014-10-16 MED ORDER — ONDANSETRON HCL 4 MG PO TABS
4.0000 mg | ORAL_TABLET | Freq: Four times a day (QID) | ORAL | Status: DC | PRN
  Administered 2014-10-17: 12:00:00 4 mg via ORAL
  Filled 2014-10-16: qty 1

## 2014-10-16 MED ORDER — IOHEXOL 300 MG/ML  SOLN
100.0000 mL | Freq: Once | INTRAMUSCULAR | Status: AC | PRN
  Administered 2014-10-16: 100 mL via INTRAVENOUS
  Filled 2014-10-16: qty 100

## 2014-10-16 MED ORDER — SENNOSIDES-DOCUSATE SODIUM 8.6-50 MG PO TABS
1.0000 | ORAL_TABLET | Freq: Every evening | ORAL | Status: DC | PRN
  Administered 2014-10-16: 1 via ORAL
  Filled 2014-10-16: qty 1

## 2014-10-16 MED ORDER — GI COCKTAIL ~~LOC~~
ORAL | Status: AC
  Administered 2014-10-16: 30 mL via ORAL
  Filled 2014-10-16: qty 30

## 2014-10-16 MED ORDER — ONDANSETRON HCL 4 MG/2ML IJ SOLN
4.0000 mg | Freq: Four times a day (QID) | INTRAMUSCULAR | Status: DC | PRN
  Administered 2014-10-16 – 2014-10-17 (×2): 4 mg via INTRAVENOUS
  Filled 2014-10-16 (×2): qty 2

## 2014-10-16 MED ORDER — TRAMADOL HCL 50 MG PO TABS
ORAL_TABLET | ORAL | Status: AC
  Filled 2014-10-16: qty 1

## 2014-10-16 MED ORDER — PANTOPRAZOLE SODIUM 40 MG IV SOLR
40.0000 mg | Freq: Two times a day (BID) | INTRAVENOUS | Status: DC
  Administered 2014-10-16: 22:00:00 40 mg via INTRAVENOUS
  Filled 2014-10-16: qty 40

## 2014-10-16 MED ORDER — MORPHINE SULFATE 2 MG/ML IJ SOLN
INTRAMUSCULAR | Status: AC
  Administered 2014-10-16: 2 mg via INTRAVENOUS
  Filled 2014-10-16: qty 1

## 2014-10-16 MED ORDER — ONDANSETRON 4 MG PO TBDP
ORAL_TABLET | ORAL | Status: AC
  Administered 2014-10-16: 4 mg via ORAL
  Filled 2014-10-16: qty 1

## 2014-10-16 MED ORDER — ALBUTEROL SULFATE (2.5 MG/3ML) 0.083% IN NEBU: 2.5000 mg | INHALATION_SOLUTION | RESPIRATORY_TRACT | Status: DC | PRN

## 2014-10-16 MED ORDER — ONDANSETRON 4 MG PO TBDP
ORAL_TABLET | ORAL | Status: AC
  Filled 2014-10-16: qty 1

## 2014-10-16 MED ORDER — TRAMADOL HCL 50 MG PO TABS
50.0000 mg | ORAL_TABLET | Freq: Once | ORAL | Status: AC
  Administered 2014-10-16: 50 mg via ORAL

## 2014-10-16 NOTE — Progress Notes (Signed)
Notified Dr Elisabeth PigeonVachhani that pt has an order for creatinine serum for Lovenox therapy  to be drawn on July 7th. Creatinine today was 0.55. MD verbalized to d/c this order. OK to give Lovenox. Urine drug screen to be cancelled as well- duplicate order.

## 2014-10-16 NOTE — Plan of Care (Signed)
Problem: Discharge Progression Outcomes Goal: Discharge plan in place and appropriate Individualization: H/O alcohol and heroin abuse, seizures, stroke and septal defect.  Currently not on CIWA. Per H&P CIWA if any withdrawals. Pt is a high fall risk due to h/o falls. Offer toileting qx1hr with safety checks.

## 2014-10-16 NOTE — ED Provider Notes (Signed)
CSN: [id number]     Arrival date & time 10/16/14  4098 History   First MD Initiated Contact with Patient 10/16/14 1002     Chief Complaint  Patient presents with  . Urinary Tract Infection   HPI Comments: 24 year old female with history of heroin and alcohol abuse presents today complaining of diffuse abdominal pain lower back pain and dysuria. This is been going on for the past 3 days. She has had some nausea and vomiting. Her appetite has been poor. She reports she has not had a bowel movement in 3 days. Has been taking her mother's Vicodin without relief of pain. She has also tried Motrin without relief of pain. No fevers, chills or sweats.  Patient is a 24 y.o. female presenting with abdominal pain. The history is provided by the patient.  Abdominal Pain Pain location:  Generalized Pain quality: aching   Pain radiates to:  Does not radiate Pain severity:  Mild Onset quality:  Gradual Duration:  3 days Timing:  Constant Progression:  Unchanged Chronicity:  Recurrent Context: not previous surgeries, not recent illness and not recent sexual activity   Relieved by:  None tried Worsened by:  Movement and urination Ineffective treatments:  NSAIDs Associated symptoms: anorexia, constipation, dysuria, nausea and vomiting   Associated symptoms: no chills, no fever and no vaginal discharge   Risk factors: has not had multiple surgeries     Past Medical History  Diagnosis Date  . Stroke   . Seizures    Past Surgical History  Procedure Laterality Date  . Wrist surgery    . Adenoidectomy     History reviewed. No pertinent family history. History  Substance Use Topics  . Smoking status: Current Every Day Smoker  . Smokeless tobacco: Not on file  . Alcohol Use: Yes   OB History    No data available     Review of Systems  Constitutional: Negative for fever and chills.  Gastrointestinal: Positive for nausea, vomiting, abdominal pain, constipation and anorexia.  Genitourinary:  Positive for dysuria and difficulty urinating. Negative for frequency and vaginal discharge.  All other systems reviewed and are negative.     Allergies  Acetaminophen  Home Medications   Prior to Admission medications   Medication Sig Start Date End Date Taking? Authorizing Provider  Aspirin-Salicylamide-Caffeine (BC HEADACHE PO) Take 1 packet by mouth daily as needed (for pain/headache).    Yes Historical Provider, MD  QUEtiapine (SEROQUEL XR) 200 MG 24 hr tablet Take 200 mg by mouth at bedtime.   Yes Historical Provider, MD   BP 122/78 mmHg  Pulse 80  Temp(Src) 98 F (36.7 C) (Oral)  Resp 18  Ht 5\' 1"  (1.549 m)  Wt 109 lb (49.442 kg)  BMI 20.61 kg/m2  SpO2 99%  LMP 10/01/2014 Physical Exam  Constitutional: She is oriented to person, place, and time. Vital signs are normal. She appears well-developed and well-nourished.  Non-toxic appearance. She does not have a sickly appearance. She does not appear ill.  HENT:  Head: Normocephalic and atraumatic.  Mouth/Throat: Oropharynx is clear and moist.  Mucous membranes moist, noted caries  Neck: Normal range of motion. Neck supple.  Cardiovascular: Normal rate, regular rhythm, normal heart sounds and intact distal pulses.  Exam reveals no gallop and no friction rub.   No murmur heard. Pulmonary/Chest: Effort normal and breath sounds normal.  Abdominal: Soft. Bowel sounds are normal. She exhibits no distension. There is generalized tenderness. There is no rebound and no guarding.  Mild  tenderness without rebound or guarding  Musculoskeletal: Normal range of motion. She exhibits tenderness.       Lumbar back: She exhibits tenderness.       Back:  Lymphadenopathy:    She has no cervical adenopathy.  Neurological: She is alert and oriented to person, place, and time.  Skin: Skin is warm and dry.  Psychiatric: She has a normal mood and affect. Her behavior is normal. Judgment and thought content normal.  Nursing note and vitals  reviewed.   ED Course  Procedures (including critical care time) Labs Review Labs Reviewed  URINALYSIS COMPLETEWITH MICROSCOPIC ([facility] ONLY) - Abnormal; Notable for the following:    Color, Urine AMBER (*)    APPearance HAZY (*)    Bilirubin Urine 2+ (*)    Ketones, ur TRACE (*)    Protein, ur 100 (*)    Leukocytes, UA TRACE (*)    Bacteria, UA RARE (*)    Squamous Epithelial / LPF TOO NUMEROUS TO COUNT (*)    All other components within normal limits  CBC WITH DIFFERENTIAL/PLATELET - Abnormal; Notable for the following:    MCV 79.6 (*)    MCH 25.5 (*)    RDW 17.8 (*)    All other components within normal limits  COMPREHENSIVE METABOLIC PANEL - Abnormal; Notable for the following:    Calcium 8.5 (*)    Albumin 3.4 (*)    AST 612 (*)    ALT 911 (*)    Alkaline Phosphatase 312 (*)    Total Bilirubin 5.6 (*)    All other components within normal limits  URINE DRUG SCREEN, QUALITATIVE ([facility] ONLY) - Abnormal; Notable for the following:    Opiate, Ur Screen POSITIVE (*)    Cannabinoid 50 Ng, Ur Butler POSITIVE (*)    Benzodiazepine, Ur Scrn POSITIVE (*)    All other components within normal limits  BILIRUBIN, DIRECT - Abnormal; Notable for the following:    Bilirubin, Direct 3.9 (*)    All other components within normal limits  PREGNANCY, URINE  LIPASE, BLOOD  HEPATITIS PANEL, ACUTE  POCT PREGNANCY, URINE   negative urine pregnancy  Imaging Review Dg Abd 1 View  10/16/2014   CLINICAL DATA:  Urinary tract infection with pain  EXAM: ABDOMEN - 1 VIEW  COMPARISON:  CT abdomen and pelvis May 27, 2013  FINDINGS: There is moderate stool in the colon. There is no bowel dilatation or air-fluid level suggesting obstruction. No free air. There are phleboliths in the pelvis. There is lumbar levoscoliosis.  IMPRESSION: Bowel gas pattern unremarkable.  Moderate stool in colon.   Electronically Signed   By: Bretta BangWilliam  Woodruff III M.D.   On: 10/16/2014 11:11   Ct Abdomen Pelvis W  Contrast  10/16/2014   CLINICAL DATA:  Abdominal pain and dysuria for 4 days  EXAM: CT ABDOMEN AND PELVIS WITH CONTRAST  TECHNIQUE: Multidetector CT imaging of the abdomen and pelvis was performed using the standard protocol following bolus administration of intravenous contrast. Oral contrast was also administered.  CONTRAST:  100mL OMNIPAQUE IOHEXOL 300 MG/ML  SOLN  COMPARISON:  None.  FINDINGS: There is mild atelectatic change in the right lung base. Lung bases are otherwise clear.  There is hepatic steatosis. No focal liver lesions are identified. The gallbladder is contracted. There is fluid in the right upper quadrant which does not appear clearly pericholecystic. There is no biliary duct dilatation.  There is wall thickening in the gastric antrum with wall thickening also noted in  the proximal duodenum region. No perforation is seen. Slightly inferior to the liver anteriorly, there is mesenteric inflammation and edema without well-defined abscess. There is localized free fluid in the right upper quadrant which surrounds the proximal duodenum and gallbladder, tracking into the porta hepatis and anterior right pararenal space. Several porta hepatis lymph nodes are noted, likely of inflammatory etiology. The largest of these lymph nodes measures 1.3 x 0.9 cm.  Spleen, pancreas, and adrenals appear normal. Kidneys bilaterally show no mass or hydronephrosis on either side. There is no renal or ureteral calculus on either side.  In the pelvis, urinary bladder is midline with normal wall thickness. There is a cyst in the right ovary measuring 3.1 x 2.1 cm. Fluid tracks from this cyst into the cul-de-sac consistent with ovarian cyst leakage/rupture. No other pelvic mass is seen. The appendix region appears normal.  There is no bowel obstruction. No free air or portal venous air. There is no abscess in the abdomen or pelvis. No lymph node prominence is seen apart from the prominence of porta hepatis lymph nodes noted  above. There is no abdominal aortic aneurysm. There are no blastic or lytic bone lesions.  IMPRESSION: There is inflammation in the right upper quadrant with thickening of the wall of the gastric antrum and proximal duodenum. There is surrounding fluid but no demonstrable contrast extravasation or perforation. The gallbladder is contracted. The apparent wall thickening of the gallbladder is likely due to surrounding inflammation, likely from the gastric antrum and duodenum, although an inflamed gallbladder is a possibility. Note that edema tracks into the mesentery slightly inferior and anterior to the liver with localized mesenteric thickening consistent with inflammation. No well-defined abscess seen. Given the appearance in the right upper quadrant, correlation with direct visualization of the gastric antrum and duodenum may be advised to further evaluate.  There is hepatic steatosis.  Evidence of recent ovarian cyst rupture on the right with free fluid in the cul-de-sac.   Electronically Signed   By: Bretta Bang III M.D.   On: 10/16/2014 14:16     EKG Interpretation None      MDM  I rechecked the patient's pulse and it is normal. I offered her IV for fluids and nausea medication and t she declines. She is tolerating by mouth in the room without difficulty. She refused naproxen stating she already had ibuprofen this morning. Ultram  PO with zofran for nausea  ----------------------------------------- 12:00 PM on 10/16/2014 ----------------------------------------- Pt's LFTs markedly elevated, reexamined and she has tenderness over RUQ and epigastric area IV access is obtained by nursing NS bolus of 1L, morphine  IVP, ordered CT abd pelvis with oral and IV contrast. Lipase added on to labs to eval for pancreatitis and evaluate gallbladder  ----------------------------------------- 2:35 PM on 10/16/2014 -----------------------------------------  Discussed with Dr. Servando Snare, GI on call.  Recommends hepatitis panel, they will see in consult  ----------------------------------------- 2:53 PM on 10/16/2014 -----------------------------------------  Discussed with internist, requests US gallbladder and then call back, they will admit. [place] ordered, morphine  IV given  ----------------------------------------- 4:12 PM on 10/16/2014 -----------------------------------------  Pt complaining of persistent pain, GI cocktail ordered. US gallbladder pending at this time, care turned over to [facility], PA-C. Plan is to admit patient to medicine if no indication for cholecystectomy.    Final diagnoses:  Mild dehydration  Constipation, unspecified constipation type  Acute hepatitis       Luvenia Redden, PA-C 10/16/14 1613  Governor Rooks, MD 10/18/14 [phone]

## 2014-10-16 NOTE — ED Notes (Addendum)
URINE SENT TO LAB  POCT= NEG LM EDT

## 2014-10-16 NOTE — H&P (Signed)
[facility] [place] [facility]   PATIENT NAME: Peggy JunglingCiara Lewis    MR#:  [id number]  DATE OF BIRTH:  1990/05/31  DATE OF ADMISSION:  10/16/2014  PRIMARY CARE PHYSICIAN: No primary care provider on file.   REQUESTING/REFERRING PHYSICIAN: Dr. Cyril LoosenKinner  CHIEF COMPLAINT:   Chief Complaint  Patient presents with  . Urinary Tract Infection  Abdominal pain  HISTORY OF PRESENT ILLNESS:  Peggy JunglingCiara Kernan  is a 24 y.o. female with a known history of alcohol and heroin abuse in the past presents to the emergency room complaining of 3 days of abdominal pain and dark urine. She also complains of some back pain. She thought this was a urine infection. Here patient has been found to have hepatitis with bilirubin of 5.6. She had alcohol binge 1 week back and 40oz beer again 2 days back. She mentions she isnt drinking as much as before. No more heroin use. Her girlfriend and mother are at bedside.  PAST MEDICAL HISTORY:   Past Medical History  Diagnosis Date  . Stroke   . Seizures   . Heroin abuse   . Alcohol abuse   . Septal defect     PAST SURGICAL HISTORY:   Past Surgical History  Procedure Laterality Date  . Wrist surgery    . Adenoidectomy      SOCIAL HISTORY:   History  Substance Use Topics  . Smoking status: Current Every Day Smoker  . Smokeless tobacco: Not on file  . Alcohol Use: 0.0 oz/week    0 Standard drinks or equivalent per week    FAMILY HISTORY:   Family History  Problem Relation Age of Onset  . Diabetes Mother   . Heart failure Maternal Grandmother     DRUG ALLERGIES:   Allergies  Allergen Reactions  . Acetaminophen Rash and Other (See Comments)    Reaction:  GI upset     REVIEW OF SYSTEMS:   Review of Systems  Constitutional: Positive for fever and malaise/fatigue. Negative for chills and weight loss.  HENT: Negative for hearing loss and nosebleeds.   Eyes: Negative for blurred vision, double vision and pain.  Respiratory:  Negative for cough, hemoptysis, sputum production, shortness of breath and wheezing.   Cardiovascular: Negative for chest pain, palpitations, orthopnea and leg swelling.  Gastrointestinal: Positive for nausea, abdominal pain and constipation. Negative for vomiting and diarrhea.  Genitourinary: Negative for dysuria and hematuria.  Musculoskeletal: Negative for myalgias, back pain and falls.  Skin: Negative for rash.  Neurological: Positive for weakness. Negative for dizziness, tremors, sensory change, speech change, focal weakness, seizures and headaches.  Endo/Heme/Allergies: Does not bruise/bleed easily.  Psychiatric/Behavioral: Negative for depression and memory loss. The patient is nervous/anxious.     MEDICATIONS AT HOME:   Prior to Admission medications   Medication Sig Start Date End Date Taking? Authorizing Provider  Aspirin-Salicylamide-Caffeine (BC HEADACHE PO) Take 1 packet by mouth daily as needed (for pain/headache).    Yes Historical Provider, MD  QUEtiapine (SEROQUEL XR) 200 MG 24 hr tablet Take 200 mg by mouth at bedtime.   Yes Historical Provider, MD      VITAL SIGNS:  Blood pressure 122/78, pulse 80, temperature 98 F (36.7 C), temperature source Oral, resp. rate 18, height 5\' 1"  (1.549 m), weight 49.442 kg (109 lb), last menstrual period 10/01/2014, SpO2 99 %.  PHYSICAL EXAMINATION:  Physical Exam  GENERAL:  24 y.o.-year-old patient lying in the bed with no acute distress.  EYES: Pupils equal,  round, reactive to light and accommodation. No scleral icterus. Extraocular muscles intact.  HEENT: Head atraumatic, normocephalic. Oropharynx and nasopharynx clear. No oropharyngeal erythema, moist oral mucosa  NECK:  Supple, no jugular venous distention. No thyroid enlargement, no tenderness.  LUNGS: Normal breath sounds bilaterally, no wheezing, rales, rhonchi. No use of accessory muscles of respiration.  CARDIOVASCULAR: S1, S2 normal. No murmurs, rubs, or gallops.   ABDOMEN: Soft. Tender epigastric and RUQ. Murphy's negative. BS normal. EXTREMITIES: No pedal edema, cyanosis, or clubbing. + 2 pedal & radial pulses b/l.   NEUROLOGIC: Cranial nerves II through XII are intact. No focal Motor or sensory deficits appreciated b/l PSYCHIATRIC: The patient is alert and oriented x 3. Good affect.  SKIN: No obvious rash, lesion, or ulcer. Tatoos.  LABORATORY PANEL:   CBC  Recent Labs Lab 10/16/14 1050  WBC 7.3  HGB 12.4  HCT 38.8  PLT 155   ------------------------------------------------------------------------------------------------------------------  Chemistries   Recent Labs Lab 10/16/14 1050  NA 138  K 3.6  CL 103  CO2 25  GLUCOSE 97  BUN 8  CREATININE 0.55  CALCIUM 8.5*  AST 612*  ALT 911*  ALKPHOS 312*  BILITOT 5.6*   ------------------------------------------------------------------------------------------------------------------  Cardiac Enzymes No results for input(s): TROPONINI in the last 168 hours. ------------------------------------------------------------------------------------------------------------------  RADIOLOGY:  Dg Abd 1 View  10/16/2014   CLINICAL DATA:  Urinary tract infection with pain  EXAM: ABDOMEN - 1 VIEW  COMPARISON:  CT abdomen and pelvis May 27, 2013  FINDINGS: There is moderate stool in the colon. There is no bowel dilatation or air-fluid level suggesting obstruction. No free air. There are phleboliths in the pelvis. There is lumbar levoscoliosis.  IMPRESSION: Bowel gas pattern unremarkable.  Moderate stool in colon.   Electronically Signed   By: Bretta Bang III M.D.   On: 10/16/2014 11:11   Ct Abdomen Pelvis W Contrast  10/16/2014   CLINICAL DATA:  Abdominal pain and dysuria for 4 days  EXAM: CT ABDOMEN AND PELVIS WITH CONTRAST  TECHNIQUE: Multidetector CT imaging of the abdomen and pelvis was performed using the standard protocol following bolus administration of intravenous contrast. Oral  contrast was also administered.  CONTRAST:  OMNIPAQUE IOHEXOL 300 MG/ML  SOLN  COMPARISON:  None.  FINDINGS: There is mild atelectatic change in the right lung base. Lung bases are otherwise clear.  There is hepatic steatosis. No focal liver lesions are identified. The gallbladder is contracted. There is fluid in the right upper quadrant which does not appear clearly pericholecystic. There is no biliary duct dilatation.  There is wall thickening in the gastric antrum with wall thickening also noted in the proximal duodenum region. No perforation is seen. Slightly inferior to the liver anteriorly, there is mesenteric inflammation and edema without well-defined abscess. There is localized free fluid in the right upper quadrant which surrounds the proximal duodenum and gallbladder, tracking into the porta hepatis and anterior right pararenal space. Several porta hepatis lymph nodes are noted, likely of inflammatory etiology. The largest of these lymph nodes measures 1.3 x 0.9 cm.  Spleen, pancreas, and adrenals appear normal. Kidneys bilaterally show no mass or hydronephrosis on either side. There is no renal or ureteral calculus on either side.  In the pelvis, urinary bladder is midline with normal wall thickness. There is a cyst in the right ovary measuring 3.1 x 2.1 cm. Fluid tracks from this cyst into the cul-de-sac consistent with ovarian cyst leakage/rupture. No other pelvic mass is seen. The appendix region appears  normal.  There is no bowel obstruction. No free air or portal venous air. There is no abscess in the abdomen or pelvis. No lymph node prominence is seen apart from the prominence of porta hepatis lymph nodes noted above. There is no abdominal aortic aneurysm. There are no blastic or lytic bone lesions.  IMPRESSION: There is inflammation in the right upper quadrant with thickening of the wall of the gastric antrum and proximal duodenum. There is surrounding fluid but no demonstrable contrast  extravasation or perforation. The gallbladder is contracted. The apparent wall thickening of the gallbladder is likely due to surrounding inflammation, likely from the gastric antrum and duodenum, although an inflamed gallbladder is a possibility. Note that edema tracks into the mesentery slightly inferior and anterior to the liver with localized mesenteric thickening consistent with inflammation. No well-defined abscess seen. Given the appearance in the right upper quadrant, correlation with direct visualization of the gastric antrum and duodenum may be advised to further evaluate.  There is hepatic steatosis.  Evidence of recent ovarian cyst rupture on the right with free fluid in the cul-de-sac.   Electronically Signed   By: Bretta Bang III M.D.   On: 10/16/2014 14:16   US Abdomen Limited Ruq  10/16/2014   CLINICAL DATA:  Epigastric abdominal pain.  EXAM: US ABDOMEN LIMITED - RIGHT UPPER QUADRANT  COMPARISON:  None.  FINDINGS: Gallbladder:  Gallbladder appears collapsed. The gallbladder wall is thickened measuring 5 mm. There is mild pericholecystic fluid.  Common bile duct:  Diameter: 2.2 mm.  Liver:  No focal lesion identified.  Increased in parenchymal echogenicity.  IMPRESSION: 1. Collapsed gallbladder which exhibits wall thickening. If there is a concern for acute cholecystitis consider further evaluation with a nuclear medicine hepatobiliary scan to assess patency of the cystic duct. 2. Hepatic steatosis.   Electronically Signed   By: Signa Kell M.D.   On: 10/16/2014 17:37     IMPRESSION AND PLAN:   * Acute hepatitis - Labs Suggest possible intrahepatic cause Likely Alcohol. But need to rule out hepatitis infection. Labs ordered. Consult GI. Check INR. Counseled to quit alcohol. Check UDS and Tylenol level  * Gallbladder wall thickening Likely from local inflammation. No fever. Normal WBC. Consult general surgery to see the patient for evaluation of possible cholecystectomy.  *  Possible gastroduodenitis Start Protonix IV  * Alcohol abuse Patient mentions that she is not drinking as much as in the past but binged on alcohol last week. Counseled to quit. CIWA if any withdrawals.  * DVT prophylaxis with Lovenox   All the records are reviewed and case discussed with ED provider. Management plans discussed with the patient, family and they are in agreement.  CODE STATUS: FULL CODE  TOTAL TIME TAKING CARE OF THIS PATIENT: 40 minutes.    Milagros Loll R M.D on 10/16/2014 at 6:41 PM  Between 7am to 6pm - Pager - [phone]  After 6pm go to [email or web address] - password [email or web address] [facility]  [place] [facility]  Office  [phone]  CC: Primary care physician; No primary care provider on file.

## 2014-10-16 NOTE — ED Notes (Signed)
Pt reports that she is having UTI symptoms and lower back pain. Urine is very concentrated. Reports that when she urinates there is a pressure and that she needs to go to the bathroom a lot.

## 2014-10-16 NOTE — Progress Notes (Signed)
Notified Dr Clint GuyHower that pt requested a higher dose of nicotine patch. Pt smokes a 1.5 pack/day per pt. Pt also requested to change seroquel XR 200mg  to seroquel 100mg . MD to enter orders.

## 2014-10-16 NOTE — Progress Notes (Signed)
Notified Dr Clint GuyHower that pt currently having menstrual period and lovenox ordered. MD verbalized to d/c lovenox.

## 2014-10-17 ENCOUNTER — Encounter: Payer: Self-pay | Admitting: Urgent Care

## 2014-10-17 DIAGNOSIS — B1710 Acute hepatitis C without hepatic coma: Secondary | ICD-10-CM | POA: Insufficient documentation

## 2014-10-17 DIAGNOSIS — B179 Acute viral hepatitis, unspecified: Secondary | ICD-10-CM | POA: Insufficient documentation

## 2014-10-17 DIAGNOSIS — K7200 Acute and subacute hepatic failure without coma: Secondary | ICD-10-CM

## 2014-10-17 LAB — HEPATITIS PANEL, ACUTE
HCV AB: 1.4 {s_co_ratio} — AB (ref 0.0–0.9)
Hep A IgM: NEGATIVE
Hep B C IgM: NEGATIVE
Hepatitis B Surface Ag: NEGATIVE

## 2014-10-17 LAB — COMPREHENSIVE METABOLIC PANEL
ALT: 648 U/L — AB (ref 14–54)
AST: 440 U/L — ABNORMAL HIGH (ref 15–41)
Albumin: 2.7 g/dL — ABNORMAL LOW (ref 3.5–5.0)
Alkaline Phosphatase: 240 U/L — ABNORMAL HIGH (ref 38–126)
Anion gap: 3 — ABNORMAL LOW (ref 5–15)
BUN: <5 mg/dL — ABNORMAL LOW (ref 6–20)
CHLORIDE: 109 mmol/L (ref 101–111)
CO2: 28 mmol/L (ref 22–32)
Calcium: 7.8 mg/dL — ABNORMAL LOW (ref 8.9–10.3)
Creatinine, Ser: 0.41 mg/dL — ABNORMAL LOW (ref 0.44–1.00)
Glucose, Bld: 95 mg/dL (ref 65–99)
POTASSIUM: 3.4 mmol/L — AB (ref 3.5–5.1)
Sodium: 140 mmol/L (ref 135–145)
Total Bilirubin: 7.4 mg/dL — ABNORMAL HIGH (ref 0.3–1.2)
Total Protein: 5.2 g/dL — ABNORMAL LOW (ref 6.5–8.1)

## 2014-10-17 LAB — CBC
HCT: 34.1 % — ABNORMAL LOW (ref 35.0–47.0)
Hemoglobin: 10.8 g/dL — ABNORMAL LOW (ref 12.0–16.0)
MCH: 25.7 pg — ABNORMAL LOW (ref 26.0–34.0)
MCHC: 31.8 g/dL — AB (ref 32.0–36.0)
MCV: 80.9 fL (ref 80.0–100.0)
PLATELETS: 135 10*3/uL — AB (ref 150–440)
RBC: 4.22 MIL/uL (ref 3.80–5.20)
RDW: 17.7 % — ABNORMAL HIGH (ref 11.5–14.5)
WBC: 5.4 10*3/uL (ref 3.6–11.0)

## 2014-10-17 LAB — BILIRUBIN, DIRECT: Bilirubin, Direct: 5 mg/dL — ABNORMAL HIGH (ref 0.1–0.5)

## 2014-10-17 MED ORDER — PANTOPRAZOLE SODIUM 40 MG PO TBEC
40.0000 mg | DELAYED_RELEASE_TABLET | Freq: Two times a day (BID) | ORAL | Status: DC
  Administered 2014-10-17: 10:00:00 40 mg via ORAL
  Filled 2014-10-17: qty 1

## 2014-10-17 MED ORDER — ONDANSETRON HCL 4 MG PO TABS: 4.0000 mg | ORAL_TABLET | Freq: Three times a day (TID) | ORAL | Status: DC | PRN

## 2014-10-17 MED ORDER — PANTOPRAZOLE SODIUM 40 MG PO TBEC: 40.0000 mg | DELAYED_RELEASE_TABLET | Freq: Two times a day (BID) | ORAL | Status: DC

## 2014-10-17 MED ORDER — MORPHINE SULFATE 2 MG/ML IJ SOLN
INTRAMUSCULAR | Status: AC
  Administered 2014-10-17: 10:00:00 1 mg via INTRAVENOUS
  Filled 2014-10-17: qty 1

## 2014-10-17 MED ORDER — MORPHINE SULFATE 2 MG/ML IJ SOLN
2.0000 mg | Freq: Once | INTRAMUSCULAR | Status: AC
  Administered 2014-10-17: 04:00:00 2 mg via INTRAVENOUS
  Filled 2014-10-17: qty 1

## 2014-10-17 NOTE — Plan of Care (Signed)
Problem: Discharge Progression Outcomes Goal: Other Discharge Outcomes/Goals Plan of care progress to goals: BP/HR stable. IVF infusing. Abd pain managed with prn morphine and oxycodone. An additional Morphine  ordered once for  pain. Zofran given once for nausea with improvement. Senna given for constipation, no BM during the night. Ambulated around the unit. GI and general surgery consults pending.

## 2014-10-17 NOTE — Discharge Summary (Signed)
[facility] [place] [facility]   PATIENT NAME: Peggy Lewis    MR#:  [id number]  DATE OF BIRTH:  [birth year]  DATE OF ADMISSION:  10/16/2014 ADMITTING PHYSICIAN: Milagros LollSrikar Sudini, MD  DATE OF DISCHARGE: 10/17/2014 12:12 PM  PRIMARY CARE PHYSICIAN: No primary care provider on file.    ADMISSION DIAGNOSIS:  Epigastric abdominal pain [R10.13] Acute hepatitis [K72.00] Mild dehydration [E86.0] Constipation, unspecified constipation type [K59.00]  DISCHARGE DIAGNOSIS:  Active Problems:   Hepatitis   Acute hepatitis   Acute hepatitis C virus infection without hepatic coma   SECONDARY DIAGNOSIS:   Past Medical History  Diagnosis Date  . Stroke   . Seizures   . Heroin abuse   . Alcohol abuse   . Septal defect     HOSPITAL COURSE:   24 year old female with past medical history of heroin abuse, alcohol abuse, depression who presented to the hospital due to abdominal pain and noted to have abnormal LFTs.  #1 abdominal pain with abnormal LFTs-this is likely secondary to acute hepatitis C. -There was some concern for possible acute cholecystitis although the findings on ultrasound and CT are nonspecific. Patient was afebrile and hemodynamically stable with a normal bilirubin. Patient has not been able to tolerate a regular diet without any evidence of further abdominal pain nausea vomiting and therefore being discharged home. -Patient was seen by gastroenterology and thought her abnormal liver function tests were due to hepatitis C. Patient does have a history of IV drug abuse and has shared needles with a friend who has a history of hepatitis C. -Patient's LFTs have trended down. At this point patient is being discharged home and further follow-up and treatment is to be done as an outpatient with gastroenterology and Dr. Servando SnareWohl -Patient is being discharged on Protonix twice daily given the fact that she may have mild gastritis related to alcohol abuse.  #2  depression-continue Seroquel.  #3 history of alcohol and IV drug abuse-patient was strongly advised to abstain from alcohol and drug abuse. Patient did not have any evidence of acute withdrawal while in the hospital.   DISCHARGE CONDITIONS:   Stable  CONSULTS OBTAINED:  Treatment Team:  Midge Miniumarren Wohl, MD  DRUG ALLERGIES:   Allergies  Allergen Reactions  . Acetaminophen Rash and Other (See Comments)    Reaction:  GI upset     DISCHARGE MEDICATIONS:   Discharge Medication List as of 10/17/2014 11:43 AM    START taking these medications   Details  pantoprazole (PROTONIX) 40 MG tablet Take 1 tablet (40 mg total) by mouth 2 (two) times daily., Starting 10/17/2014, Until Discontinued, Print      CONTINUE these medications which have CHANGED   Details  ondansetron (ZOFRAN) 4 MG tablet Take 1 tablet (4 mg total) by mouth every 8 (eight) hours as needed for nausea or vomiting., Starting 10/17/2014, Until Discontinued, Print      CONTINUE these medications which have NOT CHANGED   Details  Aspirin-Salicylamide-Caffeine (BC HEADACHE PO) Take 1 packet by mouth daily as needed (for pain/headache). , Until Discontinued, Historical Med    QUEtiapine (SEROQUEL) 200 MG tablet Take 200 mg by mouth at bedtime., Until Discontinued, Historical Med         DISCHARGE INSTRUCTIONS:   DIET:  Regular diet  DISCHARGE CONDITION:  Stable  ACTIVITY:  Activity as tolerated  OXYGEN:  Home Oxygen: No.   Oxygen Delivery: room air  DISCHARGE LOCATION:  home   If you experience worsening of your  admission symptoms, develop shortness of breath, life threatening emergency, suicidal or homicidal thoughts you must seek medical attention immediately by calling 911 or calling your MD immediately  if symptoms less severe.  You Must read complete instructions/literature along with all the possible adverse reactions/side effects for all the Medicines you take and that have been prescribed to you. Take  any new Medicines after you have completely understood and accpet all the possible adverse reactions/side effects.   Please note  You were cared for by a hospitalist during your hospital stay. If you have any questions about your discharge medications or the care you received while you were in the hospital after you are discharged, you can call the unit and asked to speak with the hospitalist on call if the hospitalist that took care of you is not available. Once you are discharged, your primary care physician will handle any further medical issues. Please note that NO REFILLS for any discharge medications will be authorized once you are discharged, as it is imperative that you return to your primary care physician (or establish a relationship with a primary care physician if you do not have one) for your aftercare needs so that they can reassess your need for medications and monitor your lab values.     Today   Pt. Still has some mild abdominal pain but no N/V.  Family/Friends at bedside.   VITAL SIGNS:  Blood pressure 100/64, pulse 53, temperature 98.1 F (36.7 C), temperature source Oral, resp. rate 20, height 5\' 1"  (1.549 m), weight 51.846 kg (114 lb 4.8 oz), last menstrual period 10/01/2014, SpO2 100 %.  I/O:   Intake/Output Summary (Last 24 hours) at 10/17/14 1430 Last data filed at 10/17/14 0900  Gross per 24 hour  Intake      0 ml  Output      0 ml  Net      0 ml    PHYSICAL EXAMINATION:  GENERAL:  24 y.o.-year-old patient lying in the bed with no acute distress.  EYES: Pupils equal, round, reactive to light and accommodation. No scleral icterus. Extraocular muscles intact.  HEENT: Head atraumatic, normocephalic. Oropharynx and nasopharynx clear.  NECK:  Supple, no jugular venous distention. No thyroid enlargement, no tenderness.  LUNGS: Normal breath sounds bilaterally, no wheezing, rales,rhonchi. No use of accessory muscles of respiration.  CARDIOVASCULAR: S1, S2 normal. No  murmurs, rubs, or gallops.  ABDOMEN: Soft, non-tender, non-distended. Bowel sounds present. No organomegaly or mass.  EXTREMITIES: No pedal edema, cyanosis, or clubbing.  Multiple needle track marks on LE B/l.   NEUROLOGIC: Cranial nerves II through XII are intact. No focal motor or sensory defecits b/l.  PSYCHIATRIC: The patient is alert and oriented x 3. Good affect.  SKIN: No obvious rash, lesion, or ulcer.   DATA REVIEW:   CBC  Recent Labs Lab 10/17/14 0523  WBC 5.4  HGB 10.8*  HCT 34.1*  PLT 135*    Chemistries   Recent Labs Lab 10/17/14 0523  NA 140  K 3.4*  CL 109  CO2 28  GLUCOSE 95  BUN <5*  CREATININE 0.41*  CALCIUM 7.8*  AST 440*  ALT 648*  ALKPHOS 240*  BILITOT 7.4*    Cardiac Enzymes No results for input(s): TROPONINI in the last 168 hours.  Microbiology Results  No results found for this or any previous visit.  RADIOLOGY:  Dg Abd 1 View  10/16/2014   CLINICAL DATA:  Urinary tract infection with pain  EXAM: ABDOMEN -  1 VIEW  COMPARISON:  CT abdomen and pelvis May 27, 2013  FINDINGS: There is moderate stool in the colon. There is no bowel dilatation or air-fluid level suggesting obstruction. No free air. There are phleboliths in the pelvis. There is lumbar levoscoliosis.  IMPRESSION: Bowel gas pattern unremarkable.  Moderate stool in colon.   Electronically Signed   By: Bretta Bang III M.D.   On: 10/16/2014 11:11   Ct Abdomen Pelvis W Contrast  10/16/2014   CLINICAL DATA:  Abdominal pain and dysuria for 4 days  EXAM: CT ABDOMEN AND PELVIS WITH CONTRAST  TECHNIQUE: Multidetector CT imaging of the abdomen and pelvis was performed using the standard protocol following bolus administration of intravenous contrast. Oral contrast was also administered.  CONTRAST:  OMNIPAQUE IOHEXOL 300 MG/ML  SOLN  COMPARISON:  None.  FINDINGS: There is mild atelectatic change in the right lung base. Lung bases are otherwise clear.  There is hepatic steatosis.  No focal liver lesions are identified. The gallbladder is contracted. There is fluid in the right upper quadrant which does not appear clearly pericholecystic. There is no biliary duct dilatation.  There is wall thickening in the gastric antrum with wall thickening also noted in the proximal duodenum region. No perforation is seen. Slightly inferior to the liver anteriorly, there is mesenteric inflammation and edema without well-defined abscess. There is localized free fluid in the right upper quadrant which surrounds the proximal duodenum and gallbladder, tracking into the porta hepatis and anterior right pararenal space. Several porta hepatis lymph nodes are noted, likely of inflammatory etiology. The largest of these lymph nodes measures 1.3 x 0.9 cm.  Spleen, pancreas, and adrenals appear normal. Kidneys bilaterally show no mass or hydronephrosis on either side. There is no renal or ureteral calculus on either side.  In the pelvis, urinary bladder is midline with normal wall thickness. There is a cyst in the right ovary measuring 3.1 x 2.1 cm. Fluid tracks from this cyst into the cul-de-sac consistent with ovarian cyst leakage/rupture. No other pelvic mass is seen. The appendix region appears normal.  There is no bowel obstruction. No free air or portal venous air. There is no abscess in the abdomen or pelvis. No lymph node prominence is seen apart from the prominence of porta hepatis lymph nodes noted above. There is no abdominal aortic aneurysm. There are no blastic or lytic bone lesions.  IMPRESSION: There is inflammation in the right upper quadrant with thickening of the wall of the gastric antrum and proximal duodenum. There is surrounding fluid but no demonstrable contrast extravasation or perforation. The gallbladder is contracted. The apparent wall thickening of the gallbladder is likely due to surrounding inflammation, likely from the gastric antrum and duodenum, although an inflamed gallbladder is a  possibility. Note that edema tracks into the mesentery slightly inferior and anterior to the liver with localized mesenteric thickening consistent with inflammation. No well-defined abscess seen. Given the appearance in the right upper quadrant, correlation with direct visualization of the gastric antrum and duodenum may be advised to further evaluate.  There is hepatic steatosis.  Evidence of recent ovarian cyst rupture on the right with free fluid in the cul-de-sac.   Electronically Signed   By: Bretta Bang III M.D.   On: 10/16/2014 14:16   US Abdomen Limited Ruq  10/16/2014   CLINICAL DATA:  Epigastric abdominal pain.  EXAM: US ABDOMEN LIMITED - RIGHT UPPER QUADRANT  COMPARISON:  None.  FINDINGS: Gallbladder:  Gallbladder appears collapsed. The  gallbladder wall is thickened measuring 5 mm. There is mild pericholecystic fluid.  Common bile duct:  Diameter: 2.2 mm.  Liver:  No focal lesion identified.  Increased in parenchymal echogenicity.  IMPRESSION: 1. Collapsed gallbladder which exhibits wall thickening. If there is a concern for acute cholecystitis consider further evaluation with a nuclear medicine hepatobiliary scan to assess patency of the cystic duct. 2. Hepatic steatosis.   Electronically Signed   By: Signa Kell M.D.   On: 10/16/2014 17:37      Management plans discussed with the patient, family and they are in agreement.  CODE STATUS:     Code Status Orders        Start     Ordered   10/16/14 1808  Full code   Continuous     10/16/14 1808      TOTAL TIME TAKING CARE OF THIS PATIENT: 40 minutes.    Houston Siren M.D on 10/17/2014 at 2:30 PM  Between 7am to 6pm - Pager - [phone]  After 6pm go to [email or web address] - password [email or web address] [facility]  [facility]  Office  [phone]  CC: Primary care physician; No primary care provider on file.

## 2014-10-17 NOTE — Progress Notes (Addendum)
Notified Dr Clint GuyHower that pt c/o abd pain, but pain med not due . MD to place order for pain med.
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
## 2014-10-17 NOTE — Care Management (Addendum)
Admitted to this facility with the diagnosis of Hepatitis. Mother is Elizbeth SquiresBuffie Johnson [phone]). Lives with girlfriend. [facility]. Seen Dr. Chandra BatchAmber Wilson. Medicaid Pending.  GI consult completed per Lorenza BurtonKandice Jones, NP . Recommends follow-up with Dr. Servando SnareWohl as outpatient. Discharge to home today per Dr. Nonie HoyerSaniani. Gwenette GreetBrenda S Holland RN MSN Care Management [phone]

## 2014-10-17 NOTE — Discharge Instructions (Signed)

## 2014-10-18 LAB — HEPATITIS B SURFACE ANTIBODY, QUANTITATIVE: Hepatitis B-Post: 7.6 m[IU]/mL — ABNORMAL LOW (ref >9.9)

## 2014-10-18 LAB — HEPATITIS B CORE ANTIBODY, IGM: Hep B C IgM: NEGATIVE

## 2014-10-18 LAB — HEPATITIS B SURFACE ANTIGEN: Hepatitis B Surface Ag: NEGATIVE

## 2014-10-18 LAB — HEPATITIS C ANTIBODY: HCV Ab: 1.7 s/co ratio — ABNORMAL HIGH (ref 0.0–0.9)

## 2014-10-19 ENCOUNTER — Emergency Department
Admission: EM | Admit: 2014-10-19 | Discharge: 2014-10-19 | Disposition: A | Discharge status: Home / Self Care | Payer: MEDICAID

## 2014-11-18 ENCOUNTER — Other Ambulatory Visit: Payer: Self-pay

## 2014-11-18 DIAGNOSIS — F1120 Opioid dependence, uncomplicated: Secondary | ICD-10-CM | POA: Insufficient documentation

## 2014-11-18 DIAGNOSIS — F1010 Alcohol abuse, uncomplicated: Secondary | ICD-10-CM | POA: Insufficient documentation

## 2014-11-19 ENCOUNTER — Other Ambulatory Visit: Payer: Self-pay

## 2014-11-19 ENCOUNTER — Ambulatory Visit: Payer: Self-pay | Admitting: Gastroenterology

## 2016-02-11 ENCOUNTER — Encounter: Payer: Self-pay | Admitting: Emergency Medicine

## 2016-02-11 ENCOUNTER — Emergency Department: Payer: Self-pay

## 2016-02-11 ENCOUNTER — Emergency Department
Admission: EM | Admit: 2016-02-11 | Discharge: 2016-02-11 | Disposition: A | Discharge status: Home / Self Care | Payer: Self-pay | Attending: Student in an Organized Health Care Education/Training Program | Admitting: Student in an Organized Health Care Education/Training Program

## 2016-02-11 DIAGNOSIS — S62647A Nondisplaced fracture of proximal phalanx of left little finger, initial encounter for closed fracture: Secondary | ICD-10-CM | POA: Insufficient documentation

## 2016-02-11 DIAGNOSIS — F17200 Nicotine dependence, unspecified, uncomplicated: Secondary | ICD-10-CM | POA: Insufficient documentation

## 2016-02-11 DIAGNOSIS — S62655A Nondisplaced fracture of medial phalanx of left ring finger, initial encounter for closed fracture: Secondary | ICD-10-CM

## 2016-02-11 DIAGNOSIS — Z79899 Other long term (current) drug therapy: Secondary | ICD-10-CM | POA: Insufficient documentation

## 2016-02-11 DIAGNOSIS — Y999 Unspecified external cause status: Secondary | ICD-10-CM | POA: Insufficient documentation

## 2016-02-11 DIAGNOSIS — Y9389 Activity, other specified: Secondary | ICD-10-CM | POA: Insufficient documentation

## 2016-02-11 DIAGNOSIS — Y929 Unspecified place or not applicable: Secondary | ICD-10-CM | POA: Insufficient documentation

## 2016-02-11 HISTORY — DX: Unspecified viral hepatitis C without hepatic coma: B19.20

## 2016-02-11 MED ORDER — IBUPROFEN 800 MG PO TABS
800.0000 mg | ORAL_TABLET | Freq: Once | ORAL | Status: AC
  Administered 2016-02-11: 800 mg via ORAL
  Filled 2016-02-11: qty 1

## 2016-02-11 MED ORDER — OXYCODONE HCL 5 MG PO TABS: 5.0000 mg | ORAL_TABLET | Freq: Four times a day (QID) | ORAL | 0 refills | Status: AC | PRN

## 2016-02-11 MED ORDER — IBUPROFEN 600 MG PO TABS
600.0000 mg | ORAL_TABLET | Freq: Three times a day (TID) | ORAL | 0 refills | Status: DC | PRN
Start: 2016-02-11 — End: 2021-04-20
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
## 2016-02-11 NOTE — Discharge Instructions (Signed)
Take medication that is only prescribed for you. Ibuprofen with food every 8 hours. Norco as needed for severe pain only as directed. Do not mix any other medication with these medicines. Wear splint until seen by the orthopedist. Call tomorrow to make an appointment. Ice and elevate as needed for swelling.
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
## 2016-02-11 NOTE — ED Notes (Signed)
Called in the waiting room with no answer. °
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
## 2016-03-18 ENCOUNTER — Emergency Department
Admission: EM | Admit: 2016-03-18 | Discharge: 2016-03-18 | Discharge status: Against Medical Advice | Payer: No Typology Code available for payment source | Attending: Emergency Medicine | Admitting: Emergency Medicine

## 2016-03-18 DIAGNOSIS — R1084 Generalized abdominal pain: Secondary | ICD-10-CM | POA: Insufficient documentation

## 2016-03-18 DIAGNOSIS — Z79899 Other long term (current) drug therapy: Secondary | ICD-10-CM | POA: Diagnosis not present

## 2016-03-18 DIAGNOSIS — Y92410 Unspecified street and highway as the place of occurrence of the external cause: Secondary | ICD-10-CM | POA: Diagnosis not present

## 2016-03-18 DIAGNOSIS — Y9389 Activity, other specified: Secondary | ICD-10-CM | POA: Insufficient documentation

## 2016-03-18 DIAGNOSIS — Y999 Unspecified external cause status: Secondary | ICD-10-CM | POA: Diagnosis not present

## 2016-03-18 DIAGNOSIS — G8929 Other chronic pain: Secondary | ICD-10-CM | POA: Insufficient documentation

## 2016-03-18 DIAGNOSIS — M25512 Pain in left shoulder: Secondary | ICD-10-CM | POA: Insufficient documentation

## 2016-03-18 DIAGNOSIS — Z791 Long term (current) use of non-steroidal anti-inflammatories (NSAID): Secondary | ICD-10-CM | POA: Insufficient documentation

## 2016-03-18 DIAGNOSIS — S4992XA Unspecified injury of left shoulder and upper arm, initial encounter: Secondary | ICD-10-CM | POA: Diagnosis present

## 2016-03-18 DIAGNOSIS — F17200 Nicotine dependence, unspecified, uncomplicated: Secondary | ICD-10-CM | POA: Diagnosis not present

## 2016-03-18 NOTE — ED Notes (Signed)
[facility]. This Clinical research associatewriter drew pt blood in presence of [employer] officer.

## 2016-03-18 NOTE — ED Notes (Signed)
Pt is intoxicated and speaking with [employer] officer. Visitor at bedside. Pt is ambulatory without difficulty. Pt is now requesting to have her discharge papers.  Cranston Neighborhris Gaines, PA-C in room to examine patient.

## 2016-03-18 NOTE — ED Notes (Signed)
Pt refusing IV and blood work.  Provider notified and at bedside.  Patient wants to leave AMA.  Patient given instructions of leaving AMA by Thayer Ohmhris, [place] and patient understands and has no further questions.
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
## 2017-04-15 ENCOUNTER — Encounter: Payer: Self-pay | Admitting: *Deleted

## 2017-04-15 ENCOUNTER — Other Ambulatory Visit: Payer: Self-pay

## 2017-04-15 DIAGNOSIS — Z5321 Procedure and treatment not carried out due to patient leaving prior to being seen by health care provider: Secondary | ICD-10-CM | POA: Insufficient documentation

## 2017-04-15 DIAGNOSIS — R51 Headache: Secondary | ICD-10-CM | POA: Insufficient documentation
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
## 2017-04-16 ENCOUNTER — Emergency Department
Admission: EM | Admit: 2017-04-16 | Discharge: 2017-04-16 | Disposition: A | Discharge status: Home / Self Care | Payer: Self-pay | Attending: Emergency Medicine | Admitting: Emergency Medicine

## 2017-05-13 IMAGING — CT CT ABD-PELV W/ CM
1 of 2 series · 14 of 32 positions shown, 18 images · IV contrast (omnipaque)
Comparison: None.

CLINICAL DATA: Abdominal pain and dysuria for 4 days

EXAM:
CT ABDOMEN AND PELVIS WITH CONTRAST
TECHNIQUE: Multidetector CT imaging of the abdomen and pelvis was performed
using the standard protocol following bolus administration of
intravenous contrast. Oral contrast was also administered.
CONTRAST:  100mL OMNIPAQUE IOHEXOL 300 MG/ML  SOLN

[Series 2: routine abd pel with · axial · 0.63mm/px · z∈[-430,-35]mm · 14 of 91 slices shown, 18 images]
[im 8/91  soft-tissue]
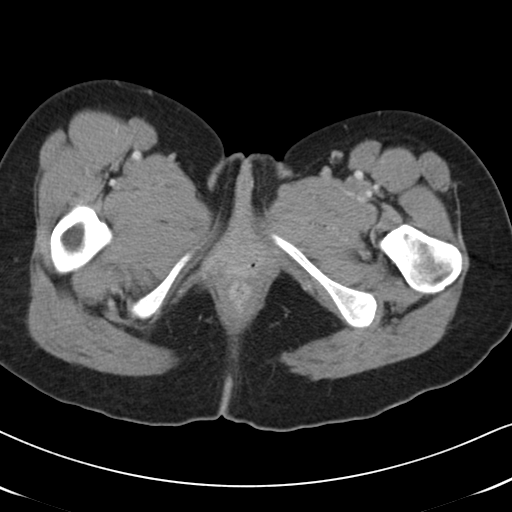
[im 8/91  bone]
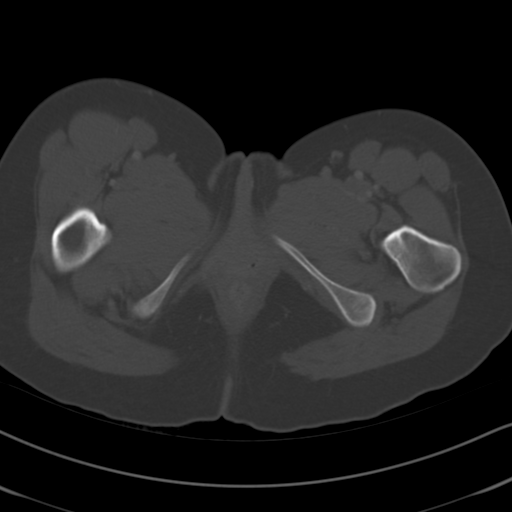
[im 15/91  soft-tissue]
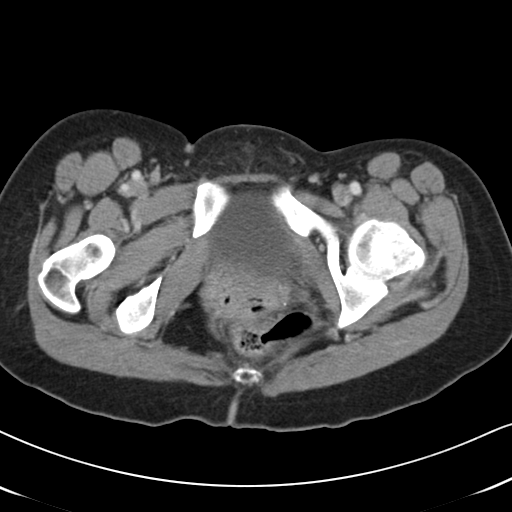
[im 22/91  soft-tissue]
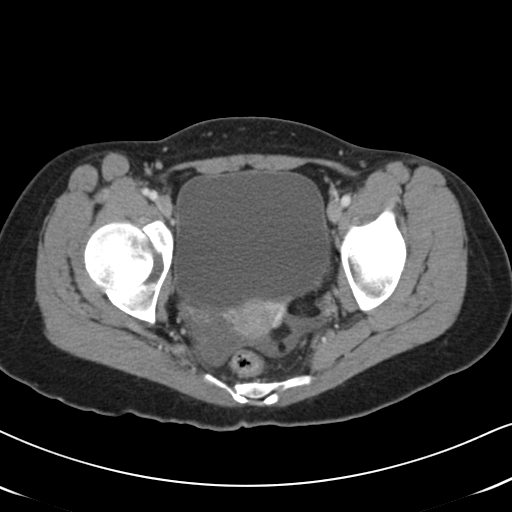
[im 29/91  soft-tissue]
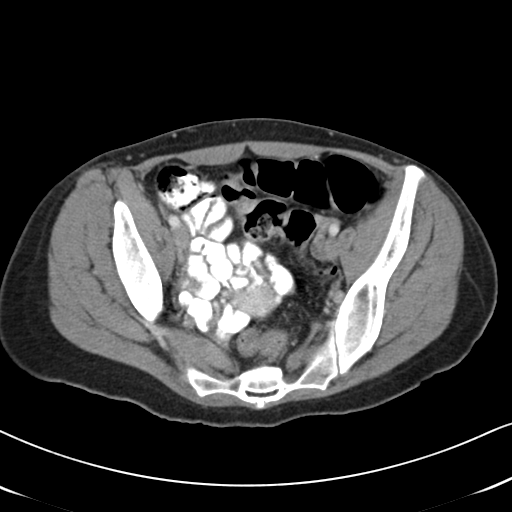
[im 37/91  soft-tissue]
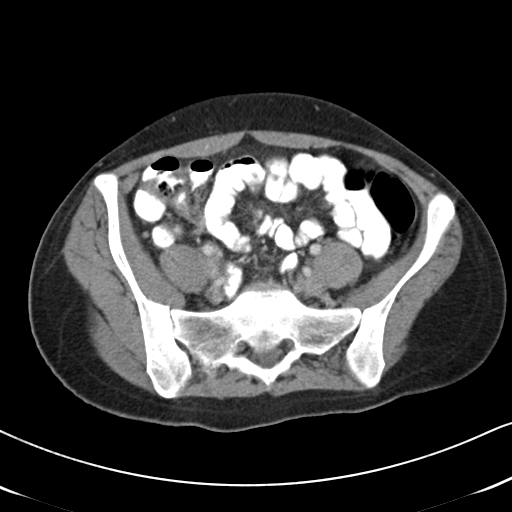
[im 44/91  soft-tissue]
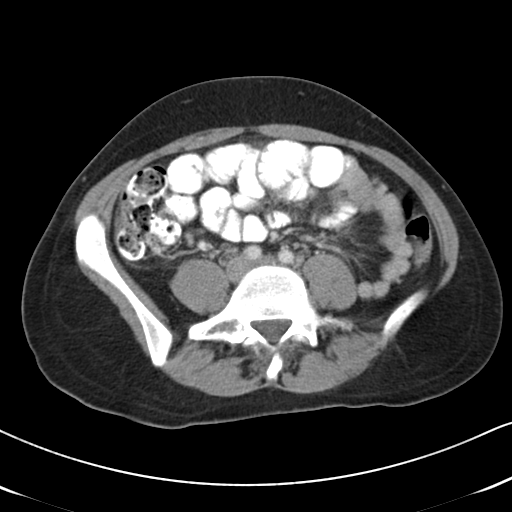
[im 51/91  soft-tissue]
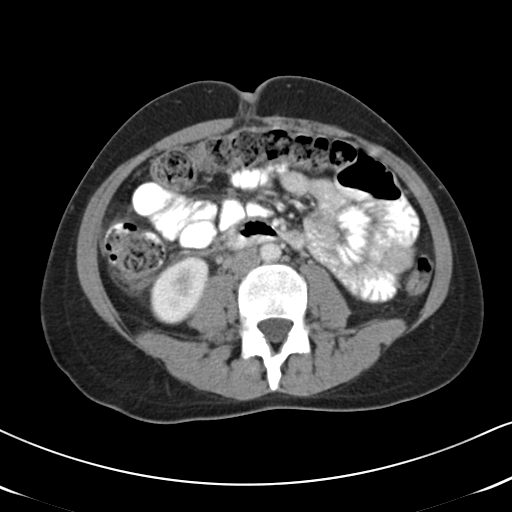
[im 58/91  soft-tissue]
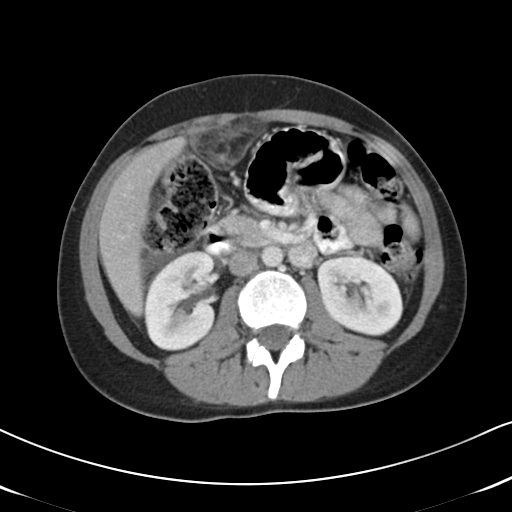
[im 65/91  soft-tissue]
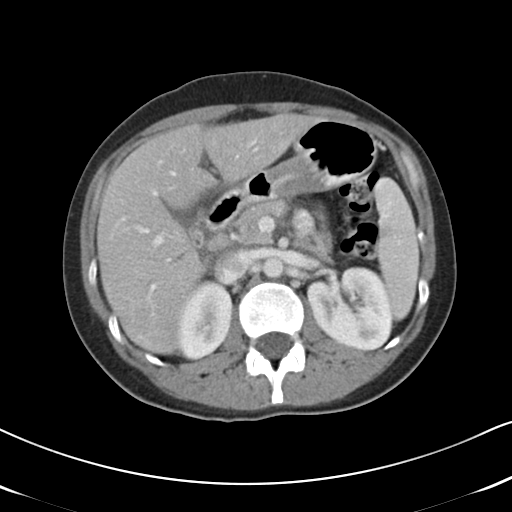
[im 65/91  bone]
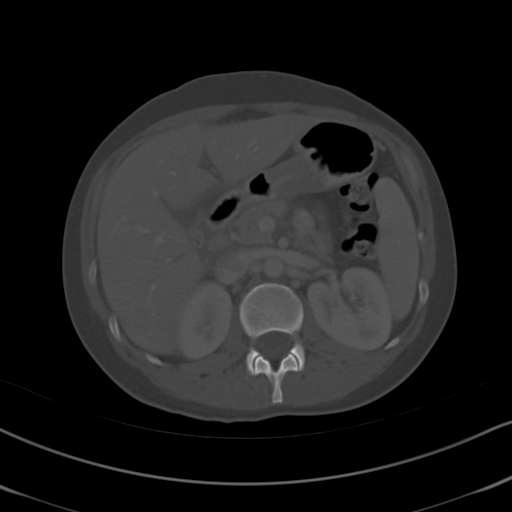
[im 73/91  soft-tissue]
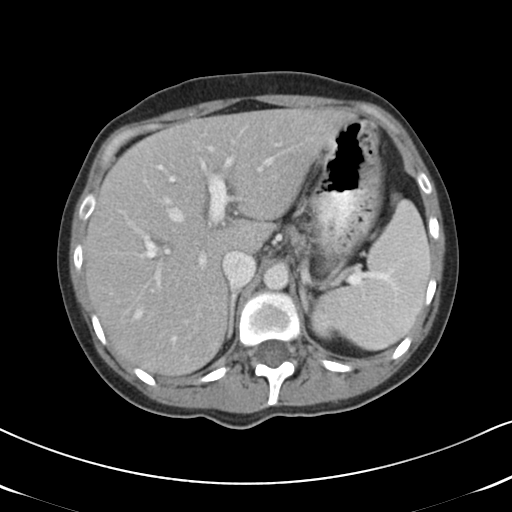
[im 76/91  lung]
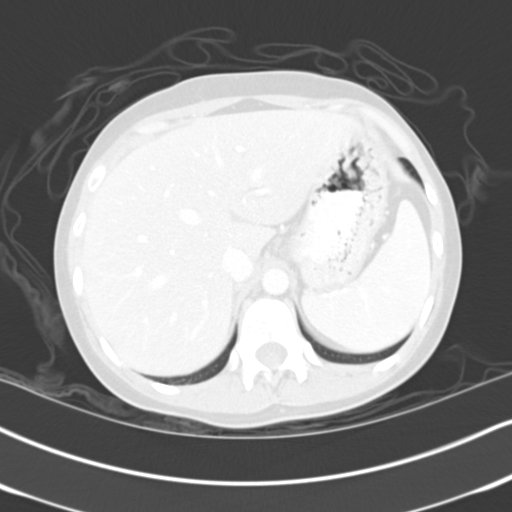
[im 80/91  soft-tissue]
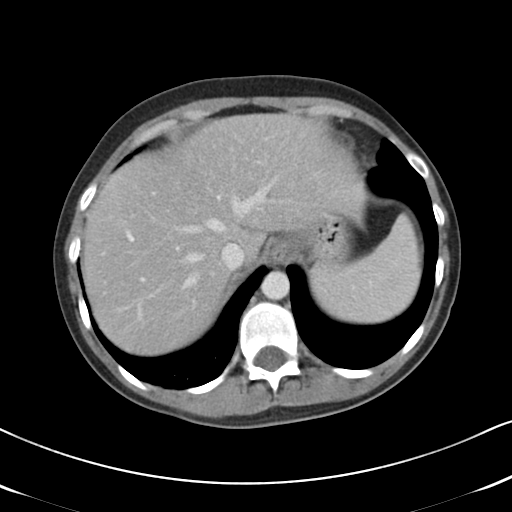
[im 80/91  lung]
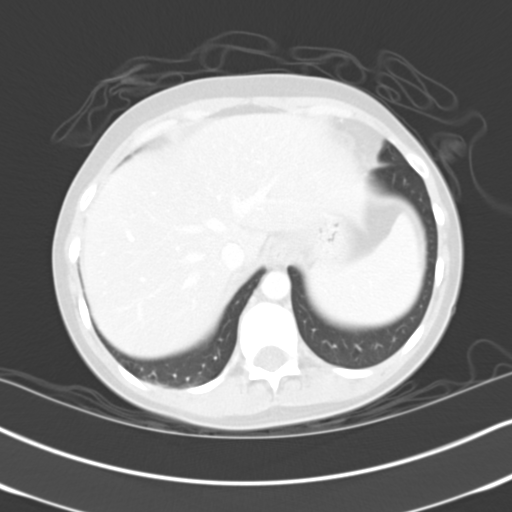
[im 83/91  lung]
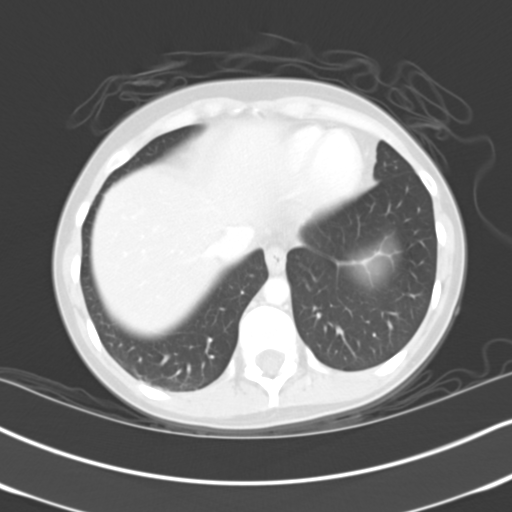
[im 87/91  soft-tissue]
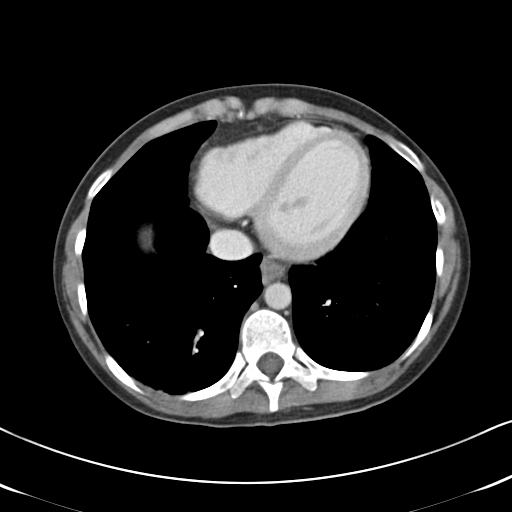
[im 87/91  lung]
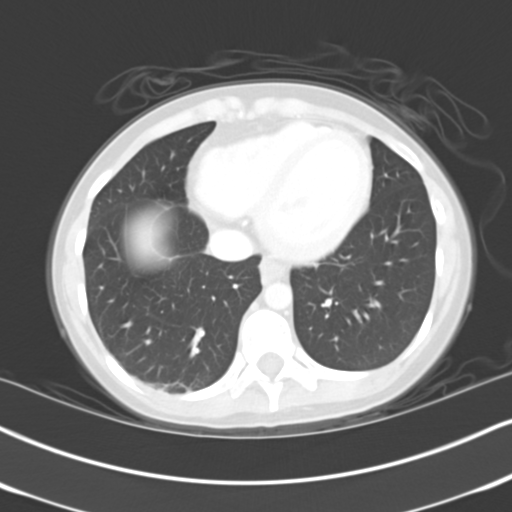

[14 of 32 positions shown; findings below may reference images not displayed]

FINDINGS: There is mild atelectatic change in the right lung base. Lung bases
are otherwise clear.

There is hepatic steatosis. No focal liver lesions are identified.
The gallbladder is contracted. There is fluid in the right upper
quadrant which does not appear clearly pericholecystic. There is no
biliary duct dilatation.

There is wall thickening in the gastric antrum with wall thickening
also noted in the proximal duodenum region. No perforation is seen.
Slightly inferior to the liver anteriorly, there is mesenteric
inflammation and edema without well-defined abscess. There is
localized free fluid in the right upper quadrant which surrounds the
proximal duodenum and gallbladder, tracking into the porta hepatis
and anterior right pararenal space. Several porta hepatis lymph
nodes are noted, likely of inflammatory etiology. The largest of
these lymph nodes measures 1.3 x 0.9 cm.

Spleen, pancreas, and adrenals appear normal. Kidneys bilaterally
show no mass or hydronephrosis on either side. There is no renal or
ureteral calculus on either side.

In the pelvis, urinary bladder is midline with normal wall
thickness. There is a cyst in the right ovary measuring 3.1 x
cm. Fluid tracks from this cyst into the cul-de-sac consistent with
ovarian cyst leakage/rupture. No other pelvic mass is seen. The
appendix region appears normal.

There is no bowel obstruction. No free air or portal venous air.
There is no abscess in the abdomen or pelvis. No lymph node
prominence is seen apart from the prominence of porta hepatis lymph
nodes noted above. There is no abdominal aortic aneurysm. There are
no blastic or lytic bone lesions.
IMPRESSION: There is inflammation in the right upper quadrant with thickening of
the wall of the gastric antrum and proximal duodenum. There is
surrounding fluid but no demonstrable contrast extravasation or
perforation. The gallbladder is contracted. The apparent wall
thickening of the gallbladder is likely due to surrounding
inflammation, likely from the gastric antrum and duodenum, although
an inflamed gallbladder is a possibility. Note that edema tracks
into the mesentery slightly inferior and anterior to the liver with
localized mesenteric thickening consistent with inflammation. No
well-defined abscess seen. Given the appearance in the right upper
quadrant, correlation with direct visualization of the gastric
antrum and duodenum may be advised to further evaluate.

There is hepatic steatosis.

Evidence of recent ovarian cyst rupture on the right with free fluid
in the cul-de-sac.

## 2018-10-11 ENCOUNTER — Emergency Department
Admission: EM | Admit: 2018-10-11 | Discharge: 2018-10-11 | Disposition: A | Discharge status: Home / Self Care | Payer: Self-pay | Attending: Emergency Medicine | Admitting: Emergency Medicine

## 2018-10-11 ENCOUNTER — Encounter: Payer: Self-pay | Admitting: Intensive Care

## 2018-10-11 ENCOUNTER — Other Ambulatory Visit: Payer: Self-pay

## 2018-10-11 DIAGNOSIS — T148XXA Other injury of unspecified body region, initial encounter: Secondary | ICD-10-CM | POA: Insufficient documentation

## 2018-10-11 DIAGNOSIS — Y999 Unspecified external cause status: Secondary | ICD-10-CM | POA: Insufficient documentation

## 2018-10-11 DIAGNOSIS — Y92410 Unspecified street and highway as the place of occurrence of the external cause: Secondary | ICD-10-CM | POA: Insufficient documentation

## 2018-10-11 DIAGNOSIS — S5012XA Contusion of left forearm, initial encounter: Secondary | ICD-10-CM | POA: Insufficient documentation

## 2018-10-11 DIAGNOSIS — Y9389 Activity, other specified: Secondary | ICD-10-CM | POA: Insufficient documentation

## 2018-10-11 MED ORDER — CYCLOBENZAPRINE HCL 10 MG PO TABS: 10.0000 mg | ORAL_TABLET | Freq: Three times a day (TID) | ORAL | 0 refills | Status: DC | PRN

## 2018-10-11 MED ORDER — IBUPROFEN 600 MG PO TABS: 600.0000 mg | ORAL_TABLET | Freq: Three times a day (TID) | ORAL | 0 refills | Status: DC | PRN
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
## 2019-08-05 ENCOUNTER — Ambulatory Visit: Payer: Self-pay | Attending: Internal Medicine

## 2019-08-05 DIAGNOSIS — Z23 Encounter for immunization: Secondary | ICD-10-CM

## 2019-08-05 NOTE — Progress Notes (Signed)
   Covid-19 Vaccination Clinic  Name:  Peggy Lewis    MRN: [id number] DOB: 14-Aug-1990  08/05/2019  [name] was observed post Covid-19 immunization for 15 minutes without incident. She was provided with Vaccine Information Sheet and instruction to access the V-Safe system.   [name] was instructed to call 911 with any severe reactions post vaccine: Mar[place] Kitchen Difficulty breathing  . Swelling of face and throat  . A fast heartbeat  . A bad rash all over body  . Dizziness and weakness   Immunizations Administered    Name Date Dose VIS Date Route   Pfizer COVID-19 Vaccine 08/05/2019 10:09 AM 0.3 mL 06/12/2018 Intramuscular   Manufacturer: [employer], [employer]   Lot: [id number]   NDC: [address]-4

## 2019-08-27 ENCOUNTER — Ambulatory Visit: Payer: Self-pay | Attending: Internal Medicine

## 2019-08-27 DIAGNOSIS — Z23 Encounter for immunization: Secondary | ICD-10-CM

## 2019-08-27 NOTE — Progress Notes (Signed)
   Covid-19 Vaccination Clinic  Name:  JANIENE AARONS    MRN: [id number] DOB: 1990-10-16  08/27/2019  Ms. Schoch was observed post Covid-19 immunization for 15 minutes without incident. She was provided with Vaccine Information Sheet and instruction to access the V-Safe system.   Ms. Azizi was instructed to call 911 with any severe reactions post vaccine: Marland Kitchen Difficulty breathing  . Swelling of face and throat  . A fast heartbeat  . A bad rash all over body  . Dizziness and weakness   Immunizations Administered    Name Date Dose VIS Date Route   Pfizer COVID-19 Vaccine 08/27/2019 10:16 AM 0.3 mL 06/12/2018 Intramuscular   Manufacturer: [employer], [employer]   Lot: [id number]   NDC: [address]-2

## 2019-10-29 DIAGNOSIS — B1710 Acute hepatitis C without hepatic coma: Secondary | ICD-10-CM | POA: Insufficient documentation

## 2021-04-20 ENCOUNTER — Other Ambulatory Visit: Payer: Self-pay

## 2021-04-20 ENCOUNTER — Ambulatory Visit (INDEPENDENT_AMBULATORY_CARE_PROVIDER_SITE_OTHER): Payer: 59 | Admitting: Internal Medicine

## 2021-04-20 ENCOUNTER — Encounter: Payer: Self-pay | Admitting: Internal Medicine

## 2021-04-20 DIAGNOSIS — F419 Anxiety disorder, unspecified: Secondary | ICD-10-CM | POA: Diagnosis not present

## 2021-04-20 DIAGNOSIS — F1121 Opioid dependence, in remission: Secondary | ICD-10-CM | POA: Insufficient documentation

## 2021-04-20 DIAGNOSIS — F1011 Alcohol abuse, in remission: Secondary | ICD-10-CM | POA: Diagnosis not present

## 2021-04-20 DIAGNOSIS — F32A Depression, unspecified: Secondary | ICD-10-CM | POA: Insufficient documentation

## 2021-04-20 DIAGNOSIS — F1191 Opioid use, unspecified, in remission: Secondary | ICD-10-CM | POA: Insufficient documentation

## 2021-04-20 DIAGNOSIS — Z8619 Personal history of other infectious and parasitic diseases: Secondary | ICD-10-CM | POA: Insufficient documentation

## 2021-04-20 MED ORDER — ESCITALOPRAM OXALATE 10 MG PO TABS: 10.0000 mg | ORAL_TABLET | Freq: Every day | ORAL | 1 refills | Status: DC

## 2021-04-20 NOTE — Assessment & Plan Note (Signed)
In remission.

## 2021-04-20 NOTE — Assessment & Plan Note (Signed)
We will trial Escitalopram 10 mg daily She declines referral for therapy at this time Support offered  RTC in 4 to 6 weeks for annual exam and follow-up of anxiety/depression

## 2021-04-20 NOTE — Assessment & Plan Note (Signed)
Continue Suboxone 

## 2021-04-20 NOTE — Patient Instructions (Signed)
Hepatitis C °Hepatitis C is a liver infection that is caused by the hepatitis C virus (HCV). The virus infects and causes inflammation in the liver. Hepatitis C can lead to: °Loss of liver function (liver failure). °Scarring of the liver (cirrhosis). °Liver cancer. °People with hepatitis C often do not know for months or years that they have this condition. This is because they have no symptoms or may have only mild symptoms. °What are the causes? °This condition is caused by HCV. The virus can spread from person to person (is contagious). It can spread through: °Contact with an infected person's blood, semen, or vaginal fluids. °Childbirth. A woman who has hepatitis C can pass it to her baby during birth. °Having received donated blood (blood transfusion) or an organ transplant that was done in the [place] before 1992. °What increases the risk? °The following factors may make you more likely to develop this condition: °Having contact with needles or syringes that have HCV on them (are contaminated). This may happen while injecting drugs, getting a tattoo or body piercing, or receiving acupuncture. Acupuncture is a treatment that inserts thin needles through your skin. Contact also may happen when you: °Have sex with someone who is infected. The virus can spread through vaginal, oral, or anal sex. °Receive treatment to filter your blood (kidney dialysis). °Have a job that involves contact with blood or body fluids, such as in health care. °Having HIV (human immunodeficiency virus) or AIDS (acquired immunodeficiency syndrome). °What are the signs or symptoms? °Symptoms of this condition include: °Tiredness (fatigue). °Loss of appetite. °Nausea or vomiting. °Pain in your abdomen. °Dark yellow urine. °Yellowing of your skin or the white parts of your eyes (jaundice). °Itchy skin. °Light-colored or tan stool. °Joint pain. °Bleeding and bruising that happen often. °Fluid buildup in your stomach (ascites). °Often,  hepatitis C causes no symptoms. °How is this diagnosed? °This condition is diagnosed with: °Blood tests. °Other tests that show how well your liver is working. These tests may include: °Magnetic resonance elastography (MRE). This imaging test uses MRI and sound waves to measure liver stiffness. °Transient elastography. This imaging test uses ultrasound to measure liver stiffness. °Liver biopsy. In this test, a tissue sample is taken from your liver and looked at under a microscope. °How is this treated? °Treatment may depend on how severe your condition is, how long it has lasted, and whether you have liver damage. Treatment may include: °Taking antiviral medicines and other medicines. °Having follow-up treatments every 6-12 months for infections or other liver problems. °Having a liver transplant. °Follow these instructions at home: °Medicines °Take over-the-counter and prescription medicines only as told by your health care provider. °If you were prescribed an antiviral medicine, take it as told by your health care provider. Do not stop using the antiviral even if you start to feel better. °Do not take any new medicines, including over-the-counter medicines or supplements, unless your health care provider approves. °Activity °Rest as needed. °Do not have sex unless your health care provider approves. °Avoid swimming or using hot tubs if you have open sores or wounds. °Return to your normal activities as told by your health care provider. Ask your health care provider what activities are safe for you. °Ask your health care provider when you may return to school or work. °Eating and drinking ° °Eat a balanced diet with plenty of fruits and vegetables, whole grains, and lean meats or non-meat proteins, such as beans or tofu. °Drink enough fluid to keep your   urine pale yellow. °Do not drink alcohol. °General instructions °Do not share toothbrushes, nail clippers, or razors. °Wash your hands often with soap and water  for at least 20 seconds. If soap and water are not available, use alcohol-based hand sanitizer. °Cover any cuts or open sores on your skin to prevent spreading HCV. °Keep all follow-up visits. This is important. You may need follow-up visits every 6-12 months. °How is this prevented? °There is no vaccine for hepatitis C. You can lessen your risk of coming into contact with HCV by making sure you: °Wash your hands often with soap and water for at least 20 seconds. °Do not share needles or syringes. °Use a condom every time you have vaginal, oral, or anal sex. Latex condoms offer the best protection. °Avoid handling blood or other body fluids without gloves or other protection. °Avoid getting tattoos or body piercings in shops that are not clean. °Where to find more information °Centers for Disease Control and Prevention: www.cdc.gov/hepatitis °World Health Organization: www.who.int °Contact a health care provider if you: °Have a fever or chills. °Have pain or swelling in your abdomen. °Pass dark urine. °Pass light-colored or tan stool. °Have joint pain. °Get help right away if you: °Have more fatigue. °Lose your appetite. °Cannot eat or drink without vomiting. °Develop jaundice, or your jaundice gets worse. °Bruise or bleed easily. °Summary °Hepatitis C is a liver infection that is caused by the hepatitis C virus (HCV). °This infection can lead to a loss of liver function (liver failure), scarring of the liver (cirrhosis), or liver cancer. °HCV can spread from person to person (is contagious). °Do not take any medicines, including over-the-counter medicines or supplements, unless your health care provider approves. °This information is not intended to replace advice given to you by your health care provider. Make sure you discuss any questions you have with your health care provider. °Document Revised: 02/20/2020 Document Reviewed: 02/20/2020 °Elsevier Patient Education © 2022 [employer] Inc. ° °

## 2021-04-20 NOTE — Progress Notes (Signed)
HPI  Pt presents to the clinic today to establish care and for management of the conditions listed below.  Hx of Heroin Abuse: Managed with Suboxone.  She follows with Suboxone clinic.  Hx of Alcohol Abuse: In remission.  She has been sober for 3 years.  Hx of Hep C: She was told in prison that she did not need treatment for this although she has a positive hep C antibody from 2016 in epic.  She does not follow with the Hep C clinic.  Anxiety and Depression: Chronic. She reports she sleeps too much. Her appetite varies. She does worry excessively, but denies paranoia, audio or video hallucinations. She denies SI/HI. She has been on multiple medications in the past, unable to remember all the names.  Past Medical History:  Diagnosis Date   Alcohol abuse    Hepatitis C    Heroin abuse ([facility])    Seizures ([facility])    Septal defect    Stroke [facility])     Current Outpatient Medications  Medication Sig Dispense Refill   Buprenorphine HCl-Naloxone HCl 8-2 MG FILM Place 8 mg under the tongue.     No current facility-administered medications for this visit.    Allergies  Allergen Reactions   Aspirin Other (See Comments)    GI discomfort   Acetaminophen Rash and Other (See Comments)    Reaction:  GI upset     Family History  Problem Relation Age of Onset   Diabetes Mother    Heart failure Maternal Grandmother    Colon cancer Neg Hx    Liver disease Neg Hx     Social History   Socioeconomic History   Marital status: Single    Spouse name: Not on file   Number of children: 0   Years of education: Not on file   Highest education level: Not on file  Occupational History   Not on file  Tobacco Use   Smoking status: Former    Types: Cigarettes    Quit date: 2021    Years since quitting: 2.0   Smokeless tobacco: Never  Vaping Use   Vaping Use: Every day   Substances: Nicotine, Flavoring  Substance and Sexual Activity   Alcohol use: Not Currently    Comment: no alcohol x 64yrs    Drug use: Not Currently    Comment: history of heroin usage, no drugs x 3 yrs   Sexual activity: Yes    Partners: Female  Other Topics Concern   Not on file  Social History Narrative   Lives with her mom, unemployed, no children, she has a girlfriend   Social Determinants of Corporate investment banker Strain: Not on file  Food Insecurity: Not on file  Transportation Needs: Not on file  Physical Activity: Not on file  Stress: Not on file  Social Connections: Not on file  Intimate Partner Violence: Not on file    ROS:  Constitutional: Denies fever, malaise, fatigue, headache or abrupt weight changes.  HEENT: Denies eye pain, eye redness, ear pain, ringing in the ears, wax buildup, runny nose, nasal congestion, bloody nose, or sore throat. Respiratory: Denies difficulty breathing, shortness of breath, cough or sputum production.   Cardiovascular: Denies chest pain, chest tightness, palpitations or swelling in the hands or feet.  Gastrointestinal: Denies abdominal pain, bloating, constipation, diarrhea or blood in the stool.  GU: Denies frequency, urgency, pain with urination, blood in urine, odor or discharge. Musculoskeletal: Denies decrease in range of motion, difficulty with  gait, muscle pain or joint pain and swelling.  Skin: Denies redness, rashes, lesions or ulcercations.  Neurological: Patient reports she sleeps too much.  Denies dizziness, difficulty with memory, difficulty with speech or problems with balance and coordination.  Psych: Patient has a history of anxiety and depression.  Denies SI/HI.  No other specific complaints in a complete review of systems (except as listed in HPI above).  PE:  BP (!) 92/55 (BP Location: Right Arm, Patient Position: Sitting, Cuff Size: Normal)    Pulse 79    Temp 98.4 F (36.9 C) (Temporal)    Resp 18    Ht 5\' 3"  (1.6 m)    Wt 126 lb 6.4 oz (57.3 kg)    LMP 04/13/2021 (Approximate)    SpO2 100%    BMI 22.39 kg/m  Wt Readings from  Last 3 Encounters:  04/20/21 126 lb 6.4 oz (57.3 kg)  10/11/18 118 lb 9.6 oz (53.8 kg)  04/15/17 130 lb (59 kg)    General: Appears her stated age, well developed, well nourished in NAD. HEENT: Head: normal shape and size; Eyes: sclera white and EOMs intact;  Neck: Neck supple, trachea midline. No masses, lumps or thyromegaly present.  Cardiovascular: Normal rate and rhythm.  Pulmonary/Chest: Normal effort and positive vesicular breath sounds.  Musculoskeletal: No difficulty with gait.  Neurological: Alert and oriented.  Psychiatric: Mood and affect flat. Behavior is normal. Judgment and thought content normal.     BMET    Component Value Date/Time   NA 140 10/17/2014 0523   NA 142 11/09/2013 0119   K 3.4 (L) 10/17/2014 0523   K 2.9 (L) 11/09/2013 0119   CL 109 10/17/2014 0523   CL 107 11/09/2013 0119   CO2 28 10/17/2014 0523   CO2 22 11/09/2013 0119   GLUCOSE 95 10/17/2014 0523   GLUCOSE 78 11/09/2013 0119   BUN <5 (L) 10/17/2014 0523   BUN 6 (L) 11/09/2013 0119   CREATININE 0.41 (L) 10/17/2014 0523   CREATININE 0.83 11/09/2013 0119   CALCIUM 7.8 (L) 10/17/2014 0523   CALCIUM 8.7 11/09/2013 0119   GFRNONAA >60 10/17/2014 0523   GFRNONAA >60 11/09/2013 0119   GFRAA >60 10/17/2014 0523   GFRAA >60 11/09/2013 0119    Lipid Panel  No results found for: CHOL, TRIG, HDL, CHOLHDL, VLDL, LDLCALC  CBC    Component Value Date/Time   WBC 5.4 10/17/2014 0523   RBC 4.22 10/17/2014 0523   HGB 10.8 (L) 10/17/2014 0523   HGB 10.4 (L) 11/09/2013 1259   HCT 34.1 (L) 10/17/2014 0523   HCT 44.4 11/09/2013 0119   PLT 135 (L) 10/17/2014 0523   PLT 390 11/09/2013 0119   MCV 80.9 10/17/2014 0523   MCV 90 11/09/2013 0119   MCH 25.7 (L) 10/17/2014 0523   MCHC 31.8 (L) 10/17/2014 0523   RDW 17.7 (H) 10/17/2014 0523   RDW 13.6 11/09/2013 0119   LYMPHSABS 2.9 10/16/2014 1050   LYMPHSABS 2.2 04/06/2013 1943   MONOABS 0.5 10/16/2014 1050   MONOABS 0.4 04/06/2013 1943   EOSABS 0.5  10/16/2014 1050   EOSABS 0.4 04/06/2013 1943   BASOSABS 0.1 10/16/2014 1050   BASOSABS 0.0 04/06/2013 1943    Hgb A1C No results found for: HGBA1C   Assessment and Plan:  04/08/2013, NP This visit occurred during the SARS-CoV-2 public health emergency.  Safety protocols were in place, including screening questions prior to the visit, additional usage of staff PPE, and extensive cleaning of exam room  while observing appropriate contact time as indicated for disinfecting solutions.

## 2021-04-20 NOTE — Assessment & Plan Note (Signed)
We will check HCV viral load at annual exam

## 2021-05-16 ENCOUNTER — Encounter: Payer: Self-pay | Admitting: Internal Medicine

## 2021-05-17 MED ORDER — ESCITALOPRAM OXALATE 10 MG PO TABS: 10.0000 mg | ORAL_TABLET | Freq: Every day | ORAL | 0 refills | Status: DC

## 2021-06-04 ENCOUNTER — Encounter: Payer: Self-pay | Admitting: Internal Medicine

## 2021-06-04 ENCOUNTER — Ambulatory Visit (INDEPENDENT_AMBULATORY_CARE_PROVIDER_SITE_OTHER): Payer: 59 | Admitting: Internal Medicine

## 2021-06-04 ENCOUNTER — Other Ambulatory Visit (HOSPITAL_COMMUNITY)
Admission: RE | Admit: 2021-06-04 | Discharge: 2021-06-04 | Disposition: A | Discharge status: Home / Self Care | Payer: Self-pay | Source: Ambulatory Visit | Attending: Internal Medicine | Admitting: Internal Medicine

## 2021-06-04 ENCOUNTER — Other Ambulatory Visit: Payer: Self-pay

## 2021-06-04 VITALS — BP 108/68 | HR 76 | Temp 97.1°F | Ht 63.0 in | Wt 126.0 lb

## 2021-06-04 DIAGNOSIS — F32A Depression, unspecified: Secondary | ICD-10-CM | POA: Diagnosis not present

## 2021-06-04 DIAGNOSIS — Z0001 Encounter for general adult medical examination with abnormal findings: Secondary | ICD-10-CM | POA: Diagnosis not present

## 2021-06-04 DIAGNOSIS — J329 Chronic sinusitis, unspecified: Secondary | ICD-10-CM

## 2021-06-04 DIAGNOSIS — B9789 Other viral agents as the cause of diseases classified elsewhere: Secondary | ICD-10-CM

## 2021-06-04 DIAGNOSIS — Z124 Encounter for screening for malignant neoplasm of cervix: Secondary | ICD-10-CM | POA: Insufficient documentation

## 2021-06-04 DIAGNOSIS — F419 Anxiety disorder, unspecified: Secondary | ICD-10-CM

## 2021-06-04 MED ORDER — PREDNISONE 10 MG PO TABS: ORAL_TABLET | ORAL | 0 refills | Status: DC

## 2021-06-04 MED ORDER — ESCITALOPRAM OXALATE 20 MG PO TABS: 20.0000 mg | ORAL_TABLET | Freq: Every day | ORAL | 3 refills | Status: DC

## 2021-06-04 NOTE — Patient Instructions (Signed)

## 2021-06-04 NOTE — Assessment & Plan Note (Signed)
Persistent symptoms, improved but not at goal Increase Escitalopram to 20 mg daily Support offered

## 2021-06-04 NOTE — Progress Notes (Signed)
Subjective:    Patient ID: Peggy Lewis, female    DOB: 1990-06-13, 31 y.o.   MRN: [id number]  HPI  Patient presents to clinic today for her annual exam.  Flu:03/2021 Tetanus: < 10 years ago COVID: [place] x 2 Pap smear: 2019 Dentist: biannually  Diet: She does eat meat. She consumes fruits and veggies. She does eat fried foods. She drinks mostly soda and water. Exercise: None  Review of Systems  Past Medical History:  Diagnosis Date   Alcohol abuse    Anxiety    Depression    Hepatitis C    Heroin abuse (HCC)    Seizures (HCC)    Septal defect    Stroke [facility])     Current Outpatient Medications  Medication Sig Dispense Refill   Buprenorphine HCl-Naloxone HCl 8-2 MG FILM Place 8 mg under the tongue.     escitalopram (LEXAPRO) 10 MG tablet Take 1 tablet (10 mg total) by mouth daily. 30 tablet 0   No current facility-administered medications for this visit.    Allergies  Allergen Reactions   Aspirin Other (See Comments)    GI discomfort   Acetaminophen Rash and Other (See Comments)    Reaction:  GI upset     Family History  Problem Relation Age of Onset   Diabetes Mother    Depression Father    Drug abuse Father    Heart failure Maternal Grandmother    Colon cancer Neg Hx    Liver disease Neg Hx     Social History   Socioeconomic History   Marital status: Single    Spouse name: Not on file   Number of children: 0   Years of education: Not on file   Highest education level: Not on file  Occupational History   Not on file  Tobacco Use   Smoking status: Former    Types: Cigarettes    Quit date: 2021    Years since quitting: 2.1   Smokeless tobacco: Never  Vaping Use   Vaping Use: Every day   Substances: Nicotine, Flavoring  Substance and Sexual Activity   Alcohol use: Not Currently    Comment: no alcohol x 71yrs   Drug use: Not Currently    Comment: history of heroin usage, no drugs x 3 yrs   Sexual activity: Yes    Partners: Female  Other  Topics Concern   Not on file  Social History Narrative   Lives with her mom, unemployed, no children, she has a girlfriend   Social Determinants of Radio broadcast assistant Strain: Not on file  Food Insecurity: Not on file  Transportation Needs: Not on file  Physical Activity: Not on file  Stress: Not on file  Social Connections: Not on file  Intimate Partner Violence: Not on file     Constitutional: Patient reports headache.  Denies fever, malaise, fatigue, or abrupt weight changes.  HEENT: Patient reports runny nose.  Denies eye pain, eye redness, ear pain, ringing in the ears, wax buildup, nasal congestion, bloody nose, or sore throat. Respiratory: Denies difficulty breathing, shortness of breath, cough or sputum production.   Cardiovascular: Denies chest pain, chest tightness, palpitations or swelling in the hands or feet.  Gastrointestinal: Patient reports intermittent reflux and constipation.  Denies abdominal pain, bloating, diarrhea or blood in the stool.  GU: Denies urgency, frequency, pain with urination, burning sensation, blood in urine, odor or discharge. Musculoskeletal: Denies decrease in range of motion, difficulty with gait, muscle  pain or joint pain and swelling.  Skin: Denies redness, rashes, lesions or ulcercations.  Neurological: Denies dizziness, difficulty with memory, difficulty with speech or problems with balance and coordination.  Psych: Pt has a history of anxiety and depression. Denies SI/HI.  No other specific complaints in a complete review of systems (except as listed in HPI above).     Objective:   Physical Exam BP 108/68 (BP Location: Right Arm, Patient Position: Sitting, Cuff Size: Normal)    Pulse 76    Temp (!) 97.1 F (36.2 C) (Temporal)    Ht 5\' 3"  (1.6 m)    Wt 126 lb (57.2 kg)    SpO2 100%    BMI 22.32 kg/m   Wt Readings from Last 3 Encounters:  04/20/21 126 lb 6.4 oz (57.3 kg)  10/11/18 118 lb 9.6 oz (53.8 kg)  04/15/17 130 lb (59  kg)    General: Appears her stated age, well developed, well nourished in NAD. Skin: Warm, dry and intact.  HEENT: Head: normal shape and size, frontal sinus pressure noted.; Eyes: sclera white and EOMs intact;  Neck:  Neck supple, trachea midline. No masses, lumps or thyromegaly present.  Cardiovascular: Normal rate and rhythm. S1,S2 noted.  No murmur, rubs or gallops noted. No JVD or BLE edema.  Pulmonary/Chest: Normal effort and positive vesicular breath sounds. No respiratory distress. No wheezes, rales or ronchi noted.  Abdomen: Soft and nontender. Normal bowel sounds.  Pelvic: Normal female anatomy.  Cervix without mass or lesion.  Thick white vaginal discharge noted without odor.  No CMT.  Adnexa nonpalpable. Musculoskeletal: Strength 5/5 BUE/BLE. No difficulty with gait.  Neurological: Alert and oriented. Cranial nerves II-XII grossly intact. Coordination normal.  Psychiatric: Mood and affect normal. Behavior is normal. Judgment and thought content normal.   BMET    Component Value Date/Time   NA 140 10/17/2014 0523   NA 142 11/09/2013 0119   K 3.4 (L) 10/17/2014 0523   K 2.9 (L) 11/09/2013 0119   CL 109 10/17/2014 0523   CL 107 11/09/2013 0119   CO2 28 10/17/2014 0523   CO2 22 11/09/2013 0119   GLUCOSE 95 10/17/2014 0523   GLUCOSE 78 11/09/2013 0119   BUN <5 (L) 10/17/2014 0523   BUN 6 (L) 11/09/2013 0119   CREATININE 0.41 (L) 10/17/2014 0523   CREATININE 0.83 11/09/2013 0119   CALCIUM 7.8 (L) 10/17/2014 0523   CALCIUM 8.7 11/09/2013 0119   GFRNONAA >60 10/17/2014 0523   GFRNONAA >60 11/09/2013 0119   GFRAA >60 10/17/2014 0523   GFRAA >60 11/09/2013 0119    Lipid Panel  No results found for: CHOL, TRIG, HDL, CHOLHDL, VLDL, LDLCALC  CBC    Component Value Date/Time   WBC 5.4 10/17/2014 0523   RBC 4.22 10/17/2014 0523   HGB 10.8 (L) 10/17/2014 0523   HGB 10.4 (L) 11/09/2013 1259   HCT 34.1 (L) 10/17/2014 0523   HCT 44.4 11/09/2013 0119   PLT 135 (L)  10/17/2014 0523   PLT 390 11/09/2013 0119   MCV 80.9 10/17/2014 0523   MCV 90 11/09/2013 0119   MCH 25.7 (L) 10/17/2014 0523   MCHC 31.8 (L) 10/17/2014 0523   RDW 17.7 (H) 10/17/2014 0523   RDW 13.6 11/09/2013 0119   LYMPHSABS 2.9 10/16/2014 1050   LYMPHSABS 2.2 04/06/2013 1943   MONOABS 0.5 10/16/2014 1050   MONOABS 0.4 04/06/2013 1943   EOSABS 0.5 10/16/2014 1050   EOSABS 0.4 04/06/2013 1943   BASOSABS 0.1 10/16/2014 1050  BASOSABS 0.0 04/06/2013 1943    Hgb A1C No results found for: HGBA1C          Assessment & Plan:   Preventative Health Maintenance:  Flu shot UTD Tetanus UTD per her report Encouraged her to get her covid booster Pap smear today, she declines STD screening Encouraged her to consume a balanced diet and exercise regimen Advised her to see a dentist annually Will check CBC, CMET and lipid profile today  Viral Sinusitis:  Can use a Nettie pot which can be purchased from your local pharmacy Rx for Pred taper x6 days  RTC in 1 year, sooner if needed Webb Silversmith, NP This visit occurred during the SARS-CoV-2 public health emergency.  Safety protocols were in place, including screening questions prior to the visit, additional usage of staff PPE, and extensive cleaning of exam room while observing appropriate contact time as indicated for disinfecting solutions.

## 2021-06-05 ENCOUNTER — Encounter: Payer: Self-pay | Admitting: Internal Medicine

## 2021-06-05 DIAGNOSIS — R768 Other specified abnormal immunological findings in serum: Secondary | ICD-10-CM

## 2021-06-05 LAB — COMPLETE METABOLIC PANEL WITH GFR
AG Ratio: 1.4 (calc) (ref 1.0–2.5)
ALT: 21 U/L (ref 6–29)
AST: 23 U/L (ref 10–30)
Albumin: 4.3 g/dL (ref 3.6–5.1)
Alkaline phosphatase (APISO): 45 U/L (ref 31–125)
BUN: 10 mg/dL (ref 7–25)
CO2: 27 mmol/L (ref 20–32)
Calcium: 9.4 mg/dL (ref 8.6–10.2)
Chloride: 102 mmol/L (ref 98–110)
Creat: 0.83 mg/dL (ref 0.50–0.97)
Globulin: 3 g/dL (calc) (ref 1.9–3.7)
Glucose, Bld: 71 mg/dL (ref 65–139)
Potassium: 4.3 mmol/L (ref 3.5–5.3)
Sodium: 136 mmol/L (ref 135–146)
Total Bilirubin: 0.3 mg/dL (ref 0.2–1.2)
Total Protein: 7.3 g/dL (ref 6.1–8.1)
eGFR: 97 mL/min/{1.73_m2} (ref >=60)

## 2021-06-05 LAB — CBC
HCT: 33.9 % — ABNORMAL LOW (ref 35.0–45.0)
Hemoglobin: 10.3 g/dL — ABNORMAL LOW (ref 11.7–15.5)
MCH: 23.3 pg — ABNORMAL LOW (ref 27.0–33.0)
MCHC: 30.4 g/dL — ABNORMAL LOW (ref 32.0–36.0)
MCV: 76.5 fL — ABNORMAL LOW (ref 80.0–100.0)
MPV: 10.1 fL (ref 7.5–12.5)
Platelets: 409 10*3/uL — ABNORMAL HIGH (ref 140–400)
RBC: 4.43 10*6/uL (ref 3.80–5.10)
RDW: 12.8 % (ref 11.0–15.0)
WBC: 5.3 10*3/uL (ref 3.8–10.8)

## 2021-06-05 LAB — LIPID PANEL
Cholesterol: 171 mg/dL (ref <200)
HDL: 64 mg/dL (ref >=50)
LDL Cholesterol (Calc): 90 mg/dL (calc)
Non-HDL Cholesterol (Calc): 107 mg/dL (calc) (ref <130)
Total CHOL/HDL Ratio: 2.7 (calc) (ref <5.0)
Triglycerides: 76 mg/dL (ref <150)

## 2021-06-08 LAB — CYTOLOGY - PAP
Adequacy: ABSENT
Diagnosis: NEGATIVE

## 2021-06-17 ENCOUNTER — Encounter: Payer: Self-pay | Admitting: Internal Medicine

## 2021-10-29 ENCOUNTER — Encounter: Payer: Self-pay | Admitting: Internal Medicine

## 2021-11-12 ENCOUNTER — Telehealth: Payer: Self-pay | Admitting: *Deleted

## 2021-11-12 NOTE — Telephone Encounter (Signed)
Patient called in asking for OUD appt. States she's been sober x 3 years. Takes suboxone 8 mg BID. Also, on Adderall BID. States Provider who was prescribing these meds closed down unexpectedly. She was given first available appt for 8/4 with [facility].

## 2021-11-15 MED ORDER — AMPHETAMINE-DEXTROAMPHET ER 15 MG PO CP24: 15.0000 mg | ORAL_CAPSULE | Freq: Two times a day (BID) | ORAL | 0 refills | Status: DC

## 2021-11-15 MED ORDER — ESCITALOPRAM OXALATE 20 MG PO TABS: 20.0000 mg | ORAL_TABLET | Freq: Every day | ORAL | 3 refills | Status: DC

## 2021-11-15 NOTE — Addendum Note (Signed)
Addended by: Kavin Leech E on: 11/15/2021 10:20 AM   Modules accepted: Orders

## 2021-11-16 ENCOUNTER — Ambulatory Visit: Payer: 59 | Admitting: Internal Medicine

## 2021-11-19 ENCOUNTER — Ambulatory Visit (INDEPENDENT_AMBULATORY_CARE_PROVIDER_SITE_OTHER): Payer: 59 | Admitting: Student

## 2021-11-19 ENCOUNTER — Encounter: Payer: Self-pay | Admitting: Student

## 2021-11-19 ENCOUNTER — Other Ambulatory Visit: Payer: Self-pay

## 2021-11-19 VITALS — BP 110/78 | HR 88 | Temp 98.4°F | Resp 28 | Ht 63.0 in | Wt 140.5 lb

## 2021-11-19 DIAGNOSIS — Z79891 Long term (current) use of opiate analgesic: Secondary | ICD-10-CM | POA: Diagnosis not present

## 2021-11-19 DIAGNOSIS — F1191 Opioid use, unspecified, in remission: Secondary | ICD-10-CM | POA: Insufficient documentation

## 2021-11-19 DIAGNOSIS — F1190 Opioid use, unspecified, uncomplicated: Secondary | ICD-10-CM | POA: Insufficient documentation

## 2021-11-19 MED ORDER — BUPRENORPHINE HCL-NALOXONE HCL 8-2 MG SL FILM: 1.0000 | ORAL_FILM | Freq: Two times a day (BID) | SUBLINGUAL | 0 refills | Status: AC

## 2021-11-19 MED ORDER — BUPRENORPHINE HCL-NALOXONE HCL 8-2 MG SL FILM: 1.0000 | ORAL_FILM | Freq: Two times a day (BID) | SUBLINGUAL | 0 refills | Status: DC

## 2021-11-19 NOTE — Progress Notes (Signed)
11/19/2021  Peggy Lewis presents for buprenorphine/naloxone intake visit.   I have reviewed EPIC data including labwork which was available.  I have reviewed outside records provided by patient if available.  The salient points were confirmed with the patient.    Review of substance use history (first use, substances used, any illicit purchases): History of IV heroin use, last use 3 years ago.  Last substance used: Heroin  If last substance not an opioid, last opioid used (type, dose, route, withdrawal symptoms): IV heroin, 3 years ago  Mental Health History: Anxiety and depression  Current counseling/behavioural health provider: None, PCP this Peggy Reaper, NP in [place]  This patient has Opioid Use Disorder by following DSM-V criteria: Keep those that apply.  - Opioids taken in larger amounts or over a longer period than intended - Persistent desire to cut down - A great deal of time is spent to obtain/use/recover from the opioid - Cravings to use opioids - Use resulting in a failure to fulfill major role obligations - Continue opioid use despite persistent social or interpersonal problems - Important activities are given up or reduced because of opioid use - Recurrent opioid use in situations in which it is physically hazardous - Use despite knowledge of health problems caused by opioids - Tolerance - Withdrawal  Past Medical History:  Diagnosis Date   Alcohol abuse    Anxiety    Depression    Hepatitis C    Heroin abuse (HCC)    Seizures (HCC)    Septal defect    Stroke [facility])     Current Outpatient Medications on File Prior to Visit  Medication Sig Dispense Refill   amphetamine-dextroamphetamine (ADDERALL XR) 15 MG 24 hr capsule Take 1 capsule by mouth 2 (two) times daily. 60 capsule 0   escitalopram (LEXAPRO) 20 MG tablet Take 1 tablet (20 mg total) by mouth daily. 90 tablet 3   famotidine (PEPCID) 10 MG tablet Take 10 mg by mouth daily.     predniSONE (DELTASONE)  10 MG tablet Take 6 tabs on day 1, 5 tabs on day 2, 4 tabs on day 3, 3 tabs on day 4, 2 tabs on day 5, 1 tab on day 6 21 tablet 0   No current facility-administered medications on file prior to visit.   Physical Exam  Vitals:   11/19/21 1042  BP: 110/78  Pulse: 88  Resp: (!) 28  Temp: 98.4 F (36.9 C)  TempSrc: Oral  SpO2: 100%  Weight: 140 lb 8 oz (63.7 kg)  Height: 5\' 3"  (1.6 m)  General: Pleasant, well-appearing middle-age woman. No acute distress. HEENT: No tooth decay. Moist mucous membranes CV: RRR. No murmurs, rubs, or gallops. No LE edema Pulmonary: Lungs CTAB. Normal effort. No wheezing or rales. Abdominal: Soft, nontender, nondistended. Normal bowel sounds. Extremities: Palpable radial and DP pulses. Normal ROM. Skin: Warm and dry. No obvious rash or lesions. Neuro: A&Ox3. Moves all extremities. Normal sensation. No focal deficit. Psych: Normal mood and affect   Clinical Opiate Withdrawal Scale: bold applicable COWS scoring   - Resting HR:    - 0 for < 80   - 1 for 81 - 100   - 2 for 101 - 120   - 4 for > 120  - Sweating:   - 0 for no chills/flushing   - 1 for subjective chills/flushing   - 3 for beads of sweat on brow/face   - 4 for sweat streaming off of face  -  Restlessness:    - 0 for able to sit still   - 1 for subjective difficulty sitting still   - 3 for frequent shifting or extraneous movement   - 5 for unable to sit still for more than a few seconds  - Pupil size:    - 0 for pinpoint or normal   - 1 for possibly larger than normal   - 2 for moderately dilated   - 5 for only iris rim visible  - Bone/joint pain:    - 0 for not present   - 1 for mild diffuse discomfort   - 2 severe diffuse aching   - 4 for objectively rubbing joints/muscles and obviously in pain  - Runny nose/tearing:    - 0 for not present   - 1 for stuffy nose/moist eyes   - 2 for nose running/tearing   - 4 for nose constantly running or tears streaming down cheeks  - GI  Upset:    - 0 for no GI symptoms   - 1 for stomach cramps   - 2 for nausea or loose stool   - 3 for vomiting or diarrhea   - 5 for multiple episodes of vomiting or diarrhea  - Tremor observation of outstretched hands:    - 0 for no tremor   - 1 for tremor can be felt but not observed   - 2 for slight tremor observable   - 4 for gross tremor or muscle twitching  - Yawning:    - 0 for no yawning   - 1 for yawning once or twice during assessment   - 2 for yawning three or more times during assessment   - 4 for yawning several times per minute  - Anxiety or irritability:    - 0 for none   - 1 for patient reports increasing irritability or anxiousness   - 2 for patient obviously irritable/anxious   - 4 for patient so irritable/anxious that assessment is difficult  - Gooseflesh:    - 0 for skin is smooth   - 3 for piloerection of skin can be felt or seen   - 5 for prominent piloerection  TOTAL: 2  Assessment/Plan:   Based on a review of the patient's medical history including substance use and mental health factors, and physical exam, Peggy Lewis is a suitable candidate for MAT with buprenorphine/naloxone. UDS ordered this visit.    I have discussed HIV and Hepatitis C screening with this patient. Patient has a history of hepatitis C due to IV drug use. Last hepatitis C screening 7 years ago showed that patient has antibodies to hepatitis C. Currently pending hepatitis C RNA quant ordered by patient's PCP.   I will place orders for CMET for baseline LFT evaluation if needed.  LFTs 5 months ago within normal limits.  Home Induction:   I have instructed the patient how to appropriately take this medication, including placing under the tongue with head relaxed for 10 minutes and allowing to dissolve without chewing or swallowing tab/film, and with nothing to eat or drink in the subsequent 15 minutes.  They have been told not to start taking the medication until they have significant  signs of withdrawal and I have explained the concept of precipitated withdrawal with the patient.    Intervisit Care:  We discussed this medication must be kept in a safe place and away from children.   Since patient has been stable on current Suboxone dose  which has been verified in PDMP, we will see the patient back in 2 months in clinic, with options for a sooner appointment based on patient and provider preference.   Patient was encouraged to call the office and speak with the MD on call for any urgent concerns.    Steffanie Rainwater, MD 11/19/2021 4:51 PM

## 2021-11-19 NOTE — Patient Instructions (Addendum)
Thank you, Ms.Hazelee J Morejon for allowing [place] to provide your care today. Today we discussed establishing with our opioid use disorder clinic.  We have signed the pain contract today and will take over the prescription of your Suboxone moving forward.  Refilled your Suboxone 8-2 mg film, twice daily  My Chart Access: [email or web address]  Please follow-up in 8 weeks  Please make sure to arrive 15 minutes prior to your next appointment. If you arrive late, you may be asked to reschedule.    We look forward to seeing you next time. Please call our clinic at [phone] if you have any questions or concerns. The best time to call is Monday-Friday from 9am-4pm, but there is someone available 24/7. If after hours or the weekend, call the main hospital number and ask for the Internal Medicine Resident On-Call. If you need medication refills, please notify your pharmacy one week in advance and they will send [place] a request.   Thank you for letting us take part in your care. Wishing you the best!  Steffanie Rainwater, MD 11/19/2021, 11:12 AM IM Resident, PGY-3 [place]ne Heck 41:10

## 2021-11-19 NOTE — Assessment & Plan Note (Addendum)
Patient is presents today to establish with the OUD clinic.  Patient states she has a PCP but she does do not prescribe Suboxone. Patient reports she has a long history of substance use disorder including abuse of heroin and alcohol. She mostly injected heroin but try other drugs when she was younger. She has been clean from opioids and sober for 3 years now. She has been receiving Suboxone treatment from [facility], Dr. Michelle Piper the past 3 years. However, she found out last month that her current Suboxone provider has shut down his practice. She was able to get 1 more month of refill from him but would like to establish with [place] to take over her Suboxone treatment. She has been stable on Suboxone 8-2 mg films, 2 films daily. She has not had any cravings, withdrawals and has not used any recreational drugs. She is currently on Adderall and Lexapro prescribed by her PCP to manage her anxiety and depression. PDMP has been reviewed and is appropriate.  Plan: -Pain contract signed today -Refilled Suboxone 8-2 mg fim, 1 film twice daily, 1 month supply -Follow-up tox screen -Follow-up in 8 weeks

## 2021-11-22 NOTE — Addendum Note (Signed)
Addended by: Erlinda Hong T on: 11/22/2021 08:17 AM   Modules accepted: Level of Service

## 2021-11-22 NOTE — Progress Notes (Signed)
Internal Medicine Clinic Attending  Case discussed with Dr. Amponsah  At the time of the visit.  We reviewed the resident's history and exam and pertinent patient test results.  I agree with the assessment, diagnosis, and plan of care documented in the resident's note.  

## 2021-11-25 LAB — TOXASSURE SELECT,+ANTIDEPR,UR

## 2021-11-25 NOTE — Progress Notes (Signed)
Urine tox screen looks appropriate. She will follow up next month for OUD follow up.

## 2021-12-06 ENCOUNTER — Ambulatory Visit (INDEPENDENT_AMBULATORY_CARE_PROVIDER_SITE_OTHER): Payer: 59 | Admitting: Internal Medicine

## 2021-12-06 ENCOUNTER — Encounter: Payer: Self-pay | Admitting: Internal Medicine

## 2021-12-06 VITALS — BP 122/78 | HR 96 | Temp 97.5°F | Wt 145.0 lb

## 2021-12-06 DIAGNOSIS — D75839 Thrombocytosis, unspecified: Secondary | ICD-10-CM

## 2021-12-06 DIAGNOSIS — K219 Gastro-esophageal reflux disease without esophagitis: Secondary | ICD-10-CM

## 2021-12-06 DIAGNOSIS — F32A Depression, unspecified: Secondary | ICD-10-CM

## 2021-12-06 DIAGNOSIS — F419 Anxiety disorder, unspecified: Secondary | ICD-10-CM

## 2021-12-06 DIAGNOSIS — D500 Iron deficiency anemia secondary to blood loss (chronic): Secondary | ICD-10-CM | POA: Diagnosis not present

## 2021-12-06 DIAGNOSIS — Z8619 Personal history of other infectious and parasitic diseases: Secondary | ICD-10-CM

## 2021-12-06 DIAGNOSIS — R768 Other specified abnormal immunological findings in serum: Secondary | ICD-10-CM

## 2021-12-06 DIAGNOSIS — F1011 Alcohol abuse, in remission: Secondary | ICD-10-CM

## 2021-12-06 DIAGNOSIS — Z79899 Other long term (current) drug therapy: Secondary | ICD-10-CM

## 2021-12-06 DIAGNOSIS — F1191 Opioid use, unspecified, in remission: Secondary | ICD-10-CM

## 2021-12-06 DIAGNOSIS — F988 Other specified behavioral and emotional disorders with onset usually occurring in childhood and adolescence: Secondary | ICD-10-CM

## 2021-12-06 MED ORDER — ESCITALOPRAM OXALATE 20 MG PO TABS: 20.0000 mg | ORAL_TABLET | Freq: Every day | ORAL | 3 refills | Status: DC

## 2021-12-06 NOTE — Assessment & Plan Note (Signed)
CSA and UDS today Continue Adderall 

## 2021-12-06 NOTE — Assessment & Plan Note (Signed)
Continue suboxone

## 2021-12-06 NOTE — Assessment & Plan Note (Signed)
CBC and iron panel today 

## 2021-12-06 NOTE — Assessment & Plan Note (Signed)
Continue Famotidine

## 2021-12-06 NOTE — Patient Instructions (Signed)

## 2021-12-06 NOTE — Assessment & Plan Note (Signed)
Continue escitalopram Support offered 

## 2021-12-06 NOTE — Assessment & Plan Note (Signed)
CBC today.  

## 2021-12-06 NOTE — Assessment & Plan Note (Signed)
Hep c RNA today

## 2021-12-06 NOTE — Progress Notes (Signed)
Subjective:    Patient ID: Peggy Lewis, female    DOB: 1991-03-02, 31 y.o.   MRN: [id number]  HPI  Patient presents to clinic today for 70-month follow-up of chronic conditions.  History of Heroin Abuse: Managed with Suboxone.  She follows with Suboxone clinic.  History of Alcohol Abuse: In remission.  She has been sober for 4 years.  History of Hep C: Has not had treatment for this.  Anxiety and Depression: Chronic managed on Escitalopram.  She is not currently seeing a therapist.  She denies SI/HI.  ADD: She reports difficulty focusing, lack of motivation.  She is taking Adderall as prescribed.  She does not follow with psychiatry.  Iron Deficiency Anemia: Her last H/H was 10.3/30.9, 05/2021.  She is not taking any oral iron at this time.  She does not follow with hematology.  Thrombocytosis: Her last platelet count was 4 9, 05/2021.  She does not follow with hematology.  GERD: She denies breakthrough on Famotidine. There is no upper GI on file.  Review of Systems  Past Medical History:  Diagnosis Date   Alcohol abuse    Anxiety    Depression    Hepatitis C    Heroin abuse (HCC)    Seizures (HCC)    Septal defect    Stroke [facility])     Current Outpatient Medications  Medication Sig Dispense Refill   amphetamine-dextroamphetamine (ADDERALL XR) 15 MG 24 hr capsule Take 1 capsule by mouth 2 (two) times daily. 60 capsule 0   Buprenorphine HCl-Naloxone HCl 8-2 MG FILM Place 1 Film under the tongue 2 (two) times daily for 28 days. 56 each 0   escitalopram (LEXAPRO) 20 MG tablet Take 1 tablet (20 mg total) by mouth daily. 90 tablet 3   famotidine (PEPCID) 10 MG tablet Take 10 mg by mouth daily.     predniSONE (DELTASONE) 10 MG tablet Take 6 tabs on day 1, 5 tabs on day 2, 4 tabs on day 3, 3 tabs on day 4, 2 tabs on day 5, 1 tab on day 6 21 tablet 0   No current facility-administered medications for this visit.    Allergies  Allergen Reactions   Aspirin Other (See Comments)     GI discomfort   Acetaminophen Rash and Other (See Comments)    Reaction:  GI upset     Family History  Problem Relation Age of Onset   Diabetes Mother    Depression Father    Drug abuse Father    Heart failure Maternal Grandmother    Colon cancer Neg Hx    Liver disease Neg Hx     Social History   Socioeconomic History   Marital status: Married    Spouse name: Not on file   Number of children: 0   Years of education: Not on file   Highest education level: Not on file  Occupational History   Not on file  Tobacco Use   Smoking status: Former    Types: Cigarettes    Quit date: 2021    Years since quitting: 2.6   Smokeless tobacco: Never  Vaping Use   Vaping Use: Every day   Substances: Nicotine, Flavoring  Substance and Sexual Activity   Alcohol use: Not Currently    Comment: no alcohol x 68yrs   Drug use: Not Currently    Comment: history of heroin usage, no drugs x 3 yrs   Sexual activity: Yes    Partners: Female  Other Topics  Concern   Not on file  Social History Narrative   Lives with her mom, unemployed, no children, she has a girlfriend   Social Determinants of Corporate investment banker Strain: Not on file  Food Insecurity: Not on file  Transportation Needs: Not on file  Physical Activity: Not on file  Stress: Not on file  Social Connections: Not on file  Intimate Partner Violence: Not on file     Constitutional: Denies fever, malaise, fatigue, headache or abrupt weight changes.  HEENT: Denies eye pain, eye redness, ear pain, ringing in the ears, wax buildup, runny nose, nasal congestion, bloody nose, or sore throat. Respiratory: Denies difficulty breathing, shortness of breath, cough or sputum production.   Cardiovascular: Denies chest pain, chest tightness, palpitations or swelling in the hands or feet.  Gastrointestinal: Denies abdominal pain, bloating, constipation, diarrhea or blood in the stool.  GU: Pt reports decreased libido. Denies  urgency, frequency, pain with urination, burning sensation, blood in urine, odor or discharge. Musculoskeletal: Denies decrease in range of motion, difficulty with gait, muscle pain or joint pain and swelling.  Skin: Denies redness, rashes, lesions or ulcercations.  Neurological: Patient reports inattention.  Denies dizziness, difficulty with memory, difficulty with speech or problems with balance and coordination.  Psych: Patient has a history of anxiety depression.  Denies SI/HI.  No other specific complaints in a complete review of systems (except as listed in HPI above).     Objective:   Physical Exam  BP 122/78 (BP Location: Left Arm, Patient Position: Sitting, Cuff Size: Normal)   Pulse 96   Temp (!) 97.5 F (36.4 C) (Temporal)   Wt 145 lb (65.8 kg)   LMP 11/11/2021   SpO2 99%   BMI 25.69 kg/m   Wt Readings from Last 3 Encounters:  11/19/21 140 lb 8 oz (63.7 kg)  06/04/21 126 lb (57.2 kg)  04/20/21 126 lb 6.4 oz (57.3 kg)    General: Appears her stated age, well developed, well nourished in NAD. Skin: Warm, dry and intact.  HEENT: Head: normal shape and size; Eyes: sclera white, no icterus, conjunctiva pink, PERRLA and EOMs intact;  Cardiovascular: Normal rate and rhythm. S1,S2 noted.  No murmur, rubs or gallops noted.  Pulmonary/Chest: Normal effort and positive vesicular breath sounds. No respiratory distress. No wheezes, rales or ronchi noted.  Neurological: Alert and oriented.  Psychiatric: Mood and affect normal. Behavior is normal. Judgment and thought content normal.    BMET    Component Value Date/Time   NA 136 06/04/2021 1341   NA 142 11/09/2013 0119   K 4.3 06/04/2021 1341   K 2.9 (L) 11/09/2013 0119   CL 102 06/04/2021 1341   CL 107 11/09/2013 0119   CO2 27 06/04/2021 1341   CO2 22 11/09/2013 0119   GLUCOSE 71 06/04/2021 1341   GLUCOSE 78 11/09/2013 0119   BUN 10 06/04/2021 1341   BUN 6 (L) 11/09/2013 0119   CREATININE 0.83 06/04/2021 1341    CALCIUM 9.4 06/04/2021 1341   CALCIUM 8.7 11/09/2013 0119   GFRNONAA >60 10/17/2014 0523   GFRNONAA >60 11/09/2013 0119   GFRAA >60 10/17/2014 0523   GFRAA >60 11/09/2013 0119    Lipid Panel     Component Value Date/Time   CHOL 171 06/04/2021 1341   TRIG 76 06/04/2021 1341   HDL 64 06/04/2021 1341   CHOLHDL 2.7 06/04/2021 1341   LDLCALC 90 06/04/2021 1341    CBC    Component Value Date/Time  WBC 5.3 06/04/2021 1341   RBC 4.43 06/04/2021 1341   HGB 10.3 (L) 06/04/2021 1341   HGB 10.4 (L) 11/09/2013 1259   HCT 33.9 (L) 06/04/2021 1341   HCT 44.4 11/09/2013 0119   PLT 409 (H) 06/04/2021 1341   PLT 390 11/09/2013 0119   MCV 76.5 (L) 06/04/2021 1341   MCV 90 11/09/2013 0119   MCH 23.3 (L) 06/04/2021 1341   MCHC 30.4 (L) 06/04/2021 1341   RDW 12.8 06/04/2021 1341   RDW 13.6 11/09/2013 0119   LYMPHSABS 2.9 10/16/2014 1050   LYMPHSABS 2.2 04/06/2013 1943   MONOABS 0.5 10/16/2014 1050   MONOABS 0.4 04/06/2013 1943   EOSABS 0.5 10/16/2014 1050   EOSABS 0.4 04/06/2013 1943   BASOSABS 0.1 10/16/2014 1050   BASOSABS 0.0 04/06/2013 1943    Hgb A1C No results found for: "HGBA1C"          Assessment & Plan:    RTC in 6 months for annual exam Nicki Reaper, NP

## 2021-12-06 NOTE — Assessment & Plan Note (Signed)
Congratulated her on her sobriety

## 2021-12-10 LAB — HEPATITIS C RNA QUANTITATIVE
HCV Quantitative Log: <1.18 log IU/mL
HCV RNA, PCR, QN: <15 IU/mL

## 2021-12-10 LAB — DRUG MONITORING, PANEL 8 WITH CONFIRMATION, URINE
6 Acetylmorphine: NEGATIVE ng/mL (ref <10)
Alcohol Metabolites: NEGATIVE ng/mL (ref <500)
Amphetamine: >15000 ng/mL — ABNORMAL HIGH (ref <250)
Amphetamines: POSITIVE ng/mL — AB (ref <500)
Benzodiazepines: NEGATIVE ng/mL (ref <100)
Buprenorphine, Urine: POSITIVE ng/mL — AB (ref <5)
Buprenorphine: 312 ng/mL — ABNORMAL HIGH (ref <2)
Cocaine Metabolite: NEGATIVE ng/mL (ref <150)
Creatinine: 133.1 mg/dL (ref >=20.0)
MDMA: NEGATIVE ng/mL (ref <500)
Marijuana Metabolite: NEGATIVE ng/mL (ref <20)
Methamphetamine: NEGATIVE ng/mL (ref <250)
Naloxone: 955 ng/mL — ABNORMAL HIGH (ref <2)
Norbuprenorphine: 770 ng/mL — ABNORMAL HIGH (ref <2)
Opiates: NEGATIVE ng/mL (ref <100)
Oxidant: NEGATIVE ug/mL (ref <200)
Oxycodone: NEGATIVE ng/mL (ref <100)
pH: 5.1 (ref 4.5–9.0)

## 2021-12-10 LAB — COMPLETE METABOLIC PANEL WITH GFR
AG Ratio: 1.5 (calc) (ref 1.0–2.5)
ALT: 39 U/L — ABNORMAL HIGH (ref 6–29)
AST: 32 U/L — ABNORMAL HIGH (ref 10–30)
Albumin: 4.4 g/dL (ref 3.6–5.1)
Alkaline phosphatase (APISO): 60 U/L (ref 31–125)
BUN: 12 mg/dL (ref 7–25)
CO2: 24 mmol/L (ref 20–32)
Calcium: 9.3 mg/dL (ref 8.6–10.2)
Chloride: 99 mmol/L (ref 98–110)
Creat: 0.67 mg/dL (ref 0.50–0.97)
Globulin: 2.9 g/dL (calc) (ref 1.9–3.7)
Glucose, Bld: 90 mg/dL (ref 65–139)
Potassium: 4 mmol/L (ref 3.5–5.3)
Sodium: 133 mmol/L — ABNORMAL LOW (ref 135–146)
Total Bilirubin: 0.3 mg/dL (ref 0.2–1.2)
Total Protein: 7.3 g/dL (ref 6.1–8.1)
eGFR: 120 mL/min/{1.73_m2} (ref >=60)

## 2021-12-10 LAB — CBC
HCT: 30.9 % — ABNORMAL LOW (ref 35.0–45.0)
Hemoglobin: 9 g/dL — ABNORMAL LOW (ref 11.7–15.5)
MCH: 20.7 pg — ABNORMAL LOW (ref 27.0–33.0)
MCHC: 29.1 g/dL — ABNORMAL LOW (ref 32.0–36.0)
MCV: 71 fL — ABNORMAL LOW (ref 80.0–100.0)
MPV: 9.5 fL (ref 7.5–12.5)
Platelets: 378 10*3/uL (ref 140–400)
RBC: 4.35 10*6/uL (ref 3.80–5.10)
RDW: 14.8 % (ref 11.0–15.0)
WBC: 5.1 10*3/uL (ref 3.8–10.8)

## 2021-12-10 LAB — IRON,TIBC AND FERRITIN PANEL
%SAT: 3 % (calc) — ABNORMAL LOW (ref 16–45)
Ferritin: 3 ng/mL — ABNORMAL LOW (ref 16–154)
Iron: 19 ug/dL — ABNORMAL LOW (ref 40–190)
TIBC: 544 mcg/dL (calc) — ABNORMAL HIGH (ref 250–450)

## 2021-12-10 LAB — DM TEMPLATE

## 2021-12-13 MED ORDER — AMPHETAMINE-DEXTROAMPHET ER 15 MG PO CP24
15.0000 mg | ORAL_CAPSULE | Freq: Two times a day (BID) | ORAL | 0 refills | Status: DC
Start: 2021-12-13 — End: 2022-01-11

## 2021-12-13 NOTE — Addendum Note (Signed)
Addended by: Lorre Munroe on: 12/13/2021 03:01 PM   Modules accepted: Orders

## 2022-01-06 ENCOUNTER — Encounter: Payer: Self-pay | Admitting: Internal Medicine

## 2022-01-06 ENCOUNTER — Ambulatory Visit (INDEPENDENT_AMBULATORY_CARE_PROVIDER_SITE_OTHER): Payer: 59 | Admitting: Internal Medicine

## 2022-01-06 ENCOUNTER — Other Ambulatory Visit: Payer: Self-pay

## 2022-01-06 VITALS — BP 103/66 | HR 79 | Temp 98.3°F | Ht 62.0 in | Wt 144.9 lb

## 2022-01-06 DIAGNOSIS — F1190 Opioid use, unspecified, uncomplicated: Secondary | ICD-10-CM

## 2022-01-06 DIAGNOSIS — F1191 Opioid use, unspecified, in remission: Secondary | ICD-10-CM | POA: Diagnosis not present

## 2022-01-06 MED ORDER — BUPRENORPHINE HCL-NALOXONE HCL 8-2 MG SL FILM: 2.0000 | ORAL_FILM | Freq: Two times a day (BID) | SUBLINGUAL | 0 refills | Status: DC

## 2022-01-06 NOTE — Patient Instructions (Signed)
Thank you, Ms.Alyxandria J Schwartz for allowing [place] to provide your care today.   OUD I sent in suboxone, 60 films. Please make a follow-up appointment by telehealth in 4 weeks.   Referrals ordered today:   Referral Orders  No referral(s) requested today     I have ordered the following medication/changed the following medications:   Stop the following medications: Medications Discontinued During This Encounter  Medication Reason   Buprenorphine HCl-Naloxone HCl 8-2 MG FILM Reorder     Start the following medications: Meds ordered this encounter  Medications   Buprenorphine HCl-Naloxone HCl 8-2 MG FILM    Sig: Place 2 Film under the tongue in the morning and at bedtime.    Dispense:  60 each    Refill:  0     Follow up:  4 weeks by telehealth    We look forward to seeing you next time. Please call our clinic at [phone] if you have any questions or concerns. The best time to call is Monday-Friday from 9am-4pm, but there is someone available 24/7. If after hours or the weekend, call the main hospital number and ask for the Internal Medicine Resident On-Call. If you need medication refills, please notify your pharmacy one week in advance and they will send [place] a request.   Thank you for trusting me with your care. Wishing you the best!   Christiana Fuchs, [facility]

## 2022-01-06 NOTE — Assessment & Plan Note (Signed)
Patient presents for follow-up on OUD. She was previously following with another clinic for 3 years and remained stable on dose. That physician retired and current PCP does not treat OUD. Last heroin use was >3 years previously. She is using 2 films daily and this controls her cravings well. She works and lives outside of [place]. Her goal is to eventually decrease suboxone use overtime. A/P: Toxassure in 8/23 was appropriate. PDMP reviewed. Last fill on 9/14 for 28 days. She will need refill on 01/27/22. Suboxone 60 tabs sent to pharmacy with refill day of 01/26/22. I asked her to make telehealth visit in 4 weeks as this would help with her work schedule. She is stable on dose and doing well on risk reduction medication.

## 2022-01-06 NOTE — Progress Notes (Signed)
Subjective:  CC: OUD  HPI:  Peggy Lewis is a 31 y.o. female with a past medical history stated below and presents today for OUD. Please see problem based assessment and plan for additional details.  Past Medical History:  Diagnosis Date   Alcohol abuse    Anxiety    Depression    Hepatitis C    Heroin abuse (HCC)    Seizures (HCC)    Septal defect    Stroke [facility])     Current Outpatient Medications on File Prior to Visit  Medication Sig Dispense Refill   amphetamine-dextroamphetamine (ADDERALL XR) 15 MG 24 hr capsule Take 1 capsule by mouth 2 (two) times daily. 60 capsule 0   escitalopram (LEXAPRO) 20 MG tablet Take 1 tablet (20 mg total) by mouth daily. 90 tablet 3   famotidine (PEPCID) 10 MG tablet Take 10 mg by mouth daily.     No current facility-administered medications on file prior to visit.    Family History  Problem Relation Age of Onset   Diabetes Mother    Depression Father    Drug abuse Father    Heart failure Maternal Grandmother    Colon cancer Neg Hx    Liver disease Neg Hx     Social History   Socioeconomic History   Marital status: Married    Spouse name: Not on file   Number of children: 0   Years of education: Not on file   Highest education level: Not on file  Occupational History   Not on file  Tobacco Use   Smoking status: Former    Types: Cigarettes    Quit date: 2021    Years since quitting: 2.7   Smokeless tobacco: Never  Vaping Use   Vaping Use: Every day   Substances: Nicotine, Flavoring  Substance and Sexual Activity   Alcohol use: Not Currently    Comment: no alcohol x 59yrs   Drug use: Not Currently    Comment: history of heroin usage, no drugs x 3 yrs   Sexual activity: Yes    Partners: Female  Other Topics Concern   Not on file  Social History Narrative   Lives with her mom, unemployed, no children, she has a girlfriend   Social Determinants of Corporate investment banker Strain: Not on file  Food  Insecurity: Not on file  Transportation Needs: Not on file  Physical Activity: Not on file  Stress: Not on file  Social Connections: Not on file  Intimate Partner Violence: Not on file    Review of Systems: ROS negative except for what is noted on the assessment and plan.  Objective:   Vitals:   01/06/22 1601  BP: 103/66  Pulse: 79  Temp: 98.3 F (36.8 C)  TempSrc: Oral  SpO2: 100%  Weight: 144 lb 14.4 oz (65.7 kg)  Height: 5\' 2"  (1.575 m)    Physical Exam: Constitutional: well-appearing, in no acute distress Cardiovascular: regular rate and rhythm, no m/r/g Pulmonary/Chest: normal work of breathing on room air, lungs clear to auscultation bilaterally MSK: normal bulk and tone Skin: warm and dry  Assessment & Plan:  Opioid use disorder in remission Patient presents for follow-up on OUD. She was previously following with another clinic for 3 years and remained stable on dose. That physician retired and current PCP does not treat OUD. Last heroin use was >3 years previously. She is using 2 films daily and this controls her cravings well. She works and  lives outside of [place]. Her goal is to eventually decrease suboxone use overtime. A/P: Toxassure in 8/23 was appropriate. PDMP reviewed. Last fill on 9/14 for 28 days. She will need refill on 01/27/22. Suboxone 60 tabs sent to pharmacy with refill day of 01/26/22. I asked her to make telehealth visit in 4 weeks as this would help with her work schedule. She is stable on dose and doing well on risk reduction medication.   Patient discussed with Dr. Ennis Forts Peggy Lewis, D.O. [facility]  PGY-2 Pager: [phone]  Phone: [phone] Date 01/06/2022  Time 6:10 PM

## 2022-01-11 ENCOUNTER — Other Ambulatory Visit: Payer: Self-pay | Admitting: Internal Medicine

## 2022-01-11 MED ORDER — AMPHETAMINE-DEXTROAMPHET ER 15 MG PO CP24: 15.0000 mg | ORAL_CAPSULE | Freq: Two times a day (BID) | ORAL | 0 refills | Status: DC

## 2022-01-14 NOTE — Progress Notes (Signed)
Internal Medicine Clinic Attending  Case discussed with Dr. Masters  At the time of the visit.  We reviewed the resident's history and exam and pertinent patient test results.  I agree with the assessment, diagnosis, and plan of care documented in the resident's note.  

## 2022-01-28 ENCOUNTER — Other Ambulatory Visit: Payer: Self-pay | Admitting: Student

## 2022-01-28 DIAGNOSIS — F1190 Opioid use, unspecified, uncomplicated: Secondary | ICD-10-CM

## 2022-02-03 ENCOUNTER — Ambulatory Visit (INDEPENDENT_AMBULATORY_CARE_PROVIDER_SITE_OTHER): Payer: 59 | Admitting: Student

## 2022-02-03 DIAGNOSIS — F1190 Opioid use, unspecified, uncomplicated: Secondary | ICD-10-CM

## 2022-02-03 MED ORDER — BUPRENORPHINE HCL-NALOXONE HCL 8-2 MG SL FILM: 1.0000 | ORAL_FILM | Freq: Every day | SUBLINGUAL | 0 refills | Status: DC

## 2022-02-03 MED ORDER — BUPRENORPHINE HCL-NALOXONE HCL 8-2 MG SL FILM: 1.0000 | ORAL_FILM | Freq: Two times a day (BID) | SUBLINGUAL | 0 refills | Status: DC

## 2022-02-04 NOTE — Progress Notes (Signed)
  [facility] Internal Medicine Residency Telephone Encounter Continuity Care Appointment  HPI:  This telephone encounter was created for Ms. Kennon Rounds on 02/04/2022 for the following purpose/cc opoid use disorder follow up.   Past Medical History:  Past Medical History:  Diagnosis Date   Alcohol abuse    Anxiety    Depression    Hepatitis C    Heroin abuse ([place])    Seizures ([place])    Septal defect    Stroke ([place])      ROS:  Mild constipation   Assessment / Plan / Recommendations:  Please see A&P under problem oriented charting for assessment of the patient's acute and chronic medical conditions.  As always, pt is advised that if symptoms worsen or new symptoms arise, they should go to an urgent care facility or to to ER for further evaluation.   Consent and Medical Decision Making:  Patient discussed with Dr. Evette Doffing This is a telephone encounter between [employer] and Iona Beard on 02/04/2022 for opoid use disorder. The visit was conducted with the patient located at home and Iona Beard at [facility]. The patient's identity was confirmed using their DOB and current address. The patient has consented to being evaluated through a telephone encounter and understands the associated risks (an examination cannot be done and the patient may need to come in for an appointment) / benefits (allows the patient to remain at home, decreasing exposure to coronavirus). I personally spent 12 minutes on medical discussion.

## 2022-02-04 NOTE — Assessment & Plan Note (Signed)
Telephone visit for OUD today. She is doing well on suboxone 2 films daily. Denies withdrawals and cravings. Has been stable on current dose for years.   Refilled suboxone 8-2mg  2 films daily  Follow up in person in 4 weeks, will alternate telehealth and in person visits as she lives and works in [employer]

## 2022-02-04 NOTE — Progress Notes (Signed)
Internal Medicine Clinic Attending  Case discussed with Dr. Liang  At the time of the visit.  We reviewed the resident's history and pertinent patient test results.  I agree with the assessment, diagnosis, and plan of care documented in the resident's note.  

## 2022-02-08 ENCOUNTER — Other Ambulatory Visit: Payer: Self-pay

## 2022-02-08 MED ORDER — AMPHETAMINE-DEXTROAMPHET ER 15 MG PO CP24: 15.0000 mg | ORAL_CAPSULE | Freq: Two times a day (BID) | ORAL | 0 refills | Status: DC

## 2022-03-09 ENCOUNTER — Other Ambulatory Visit: Payer: Self-pay | Admitting: Internal Medicine

## 2022-03-09 MED ORDER — AMPHETAMINE-DEXTROAMPHET ER 15 MG PO CP24: 15.0000 mg | ORAL_CAPSULE | Freq: Two times a day (BID) | ORAL | 0 refills | Status: DC

## 2022-03-15 NOTE — Telephone Encounter (Signed)
This was addressed by Dr. Althea Charon in my absence.

## 2022-03-21 ENCOUNTER — Encounter: Payer: Self-pay | Admitting: Internal Medicine

## 2022-03-21 ENCOUNTER — Ambulatory Visit (INDEPENDENT_AMBULATORY_CARE_PROVIDER_SITE_OTHER): Payer: 59 | Admitting: Student

## 2022-03-21 VITALS — BP 109/76 | HR 90 | Temp 98.3°F | Wt 145.3 lb

## 2022-03-21 DIAGNOSIS — F1190 Opioid use, unspecified, uncomplicated: Secondary | ICD-10-CM

## 2022-03-21 MED ORDER — BUPRENORPHINE HCL-NALOXONE HCL 8-2 MG SL SUBL: 1.0000 | SUBLINGUAL_TABLET | Freq: Two times a day (BID) | SUBLINGUAL | 0 refills | Status: DC

## 2022-03-21 NOTE — Patient Instructions (Addendum)
It was a pleasure seeing you in clinic.  We will refill your suboxone today. Please try the tablets to see if they are a lower cost. You will continue to place this under the tongue to dissolve like the films.  Please try senna and miralax as needed to help with constipation.  We will check a urine drug screen today.  Check with you primary care office regarding if they are now able to prescribe suboxone.   Please follow up for telehealth in 4 weeks.

## 2022-03-24 LAB — TOXASSURE SELECT,+ANTIDEPR,UR

## 2022-03-27 ENCOUNTER — Encounter: Payer: Self-pay | Admitting: Student

## 2022-03-27 NOTE — Progress Notes (Signed)
Established Patient Office Visit  Subjective   Patient ID: Peggy Lewis, female    DOB: 22-May-1990  Age: 31 y.o. MRN: [id number]  Chief Complaint  Patient presents with  . Medication Management    OUD follow up    Peggy Lewis is a 31 y.o. person living with a history listed below who presents to clinic for OUD follow up. Please refer to problem based charting for further details and assessment and plan of current problem and chronic medical conditions.    Patient Active Problem List   Diagnosis Date Noted  . ADD (attention deficit disorder) 12/06/2021  . Iron deficiency anemia due to chronic blood loss 12/06/2021  . Thrombocytosis 12/06/2021  . GERD (gastroesophageal reflux disease) 12/06/2021  . Opioid use disorder 11/19/2021  . History of alcohol abuse 04/20/2021  . History of hepatitis C 04/20/2021  . History of heroin use 04/20/2021  . Anxiety and depression 11/19/2013      ROS: negative as per HPI     Objective:     BP 109/76 (BP Location: Left Arm, Patient Position: Sitting, Cuff Size: Normal)   Pulse 90   Temp 98.3 F (36.8 C) (Oral)   Wt 145 lb 4.8 oz (65.9 kg)   SpO2 100%   BMI 26.58 kg/m  BP Readings from Last 3 Encounters:  03/21/22 109/76  01/06/22 103/66  12/06/21 122/78      Physical Exam Constitutional:      Appearance: Normal appearance.  HENT:     Mouth/Throat:     Mouth: Mucous membranes are moist.     Pharynx: Oropharynx is clear.  Eyes:     Extraocular Movements: Extraocular movements intact.     Pupils: Pupils are equal, round, and reactive to light.  Cardiovascular:     Rate and Rhythm: Normal rate and regular rhythm.     Heart sounds: No murmur heard. Pulmonary:     Effort: Pulmonary effort is normal.     Breath sounds: Normal breath sounds. No rhonchi or rales.  Abdominal:     General: Abdomen is flat. Bowel sounds are normal. There is no distension.     Palpations: Abdomen is soft.     Tenderness: There is no abdominal  tenderness.  Musculoskeletal:        General: Normal range of motion.     Right lower leg: No edema.     Left lower leg: No edema.  Skin:    General: Skin is warm and dry.     Capillary Refill: Capillary refill takes less than 2 seconds.  Neurological:     General: No focal deficit present.     Mental Status: She is alert and oriented to person, place, and time.  Psychiatric:        Mood and Affect: Mood normal.        Behavior: Behavior normal.    Results for orders placed or performed in visit on 03/21/22  ToxAssure Select,+Antidepr,UR  Result Value Ref Range   Summary Note        The ASCVD Risk score (Arnett DK, et al., 2019) failed to calculate for the following reasons:   The 2019 ASCVD risk score is only valid for ages 32 to 31    Assessment & Plan:   Problem List Items Addressed This Visit       Other   Opioid use disorder - Primary    Doing well on subxone. Was unable to fill her most recent script seems  frequency was written as once a day rather than twice daily. Would like to try tablets for cost. Will switch to 802 mg tablets twice daily. No replases, withdrawals or cravings. She check with PCP office who would like her to continue seeing our clinic of OUD. Check Toxassure today. Will make next visit in 4 weeks as telehealth. If she continues to do well could lengthen to every 3 months visits.       Relevant Orders   ToxAssure Select,+Antidepr,UR (Completed)    Return in about 4 weeks (around 04/18/2022).    Quincy Simmonds, MD

## 2022-03-27 NOTE — Assessment & Plan Note (Signed)
Doing well on subxone. Was unable to fill her most recent script seems frequency was written as once a day rather than twice daily. Would like to try tablets for cost. Will switch to 802 mg tablets twice daily. No replases, withdrawals or cravings. She check with PCP office who would like her to continue seeing our clinic of OUD. Check Toxassure today. Will make next visit in 4 weeks as telehealth. If she continues to do well could lengthen to every 3 months visits.

## 2022-03-29 NOTE — Progress Notes (Signed)
Internal Medicine Clinic Attending ? ?Case discussed with Dr. Liang  At the time of the visit.  We reviewed the resident?s history and exam and pertinent patient test results.  I agree with the assessment, diagnosis, and plan of care documented in the resident?s note. ? ?

## 2022-04-08 ENCOUNTER — Other Ambulatory Visit: Payer: Self-pay | Admitting: Family Medicine

## 2022-04-08 MED ORDER — AMPHETAMINE-DEXTROAMPHET ER 15 MG PO CP24: 15.0000 mg | ORAL_CAPSULE | Freq: Two times a day (BID) | ORAL | 0 refills | Status: DC

## 2022-04-19 ENCOUNTER — Ambulatory Visit (INDEPENDENT_AMBULATORY_CARE_PROVIDER_SITE_OTHER): Payer: 59 | Admitting: Internal Medicine

## 2022-04-19 DIAGNOSIS — F1190 Opioid use, unspecified, uncomplicated: Secondary | ICD-10-CM

## 2022-04-19 MED ORDER — BUPRENORPHINE HCL-NALOXONE HCL 8-2 MG SL SUBL: 1.0000 | SUBLINGUAL_TABLET | Freq: Two times a day (BID) | SUBLINGUAL | 1 refills | Status: DC

## 2022-04-19 NOTE — Progress Notes (Unsigned)
  [facility] Internal Medicine Residency Telephone Encounter Continuity Care Appointment  HPI:  This telephone encounter was created for Ms. Kennon Rounds on 04/19/2022 for the following purpose/cc OUD follow up.   Past Medical History:  Past Medical History:  Diagnosis Date   Alcohol abuse    Anxiety    Depression    Hepatitis C    Heroin abuse ([place])    Seizures ([place])    Septal defect    Stroke ([place])      ROS:  Negative unless stated in the HPI.    Assessment / Plan / Recommendations:  Please see A&P under problem oriented charting for assessment of the patient's acute and chronic medical conditions.  As always, pt is advised that if symptoms worsen or new symptoms arise, they should go to an urgent care facility or to to ER for further evaluation.   Consent and Medical Decision Making:  Patient discussed with Dr. {NAMES:[id number]::"Guilloud","E. Hoffman","Mullen","Narendra","Raines","Vincent","Williams"} This is a telephone encounter between [employer] and [employer] on 04/19/2022 for OUD follow up. The visit was conducted with the patient located at home and Idamae Schuller at [facility]. The patient's identity was confirmed using their DOB and current address. The patient has consented to being evaluated through a telephone encounter and understands the associated risks (an examination cannot be done and the patient may need to come in for an appointment) / benefits (allows the patient to remain at home, decreasing exposure to coronavirus). I personally spent {Numbers; [phone]} minutes on medical discussion.

## 2022-04-20 NOTE — Assessment & Plan Note (Addendum)
Pt complaint with dosing of suboxone. States has no cravings or relapses. States she has been getting some sweating at night since switching to tablet from the film but this is mild. Currently unsure of the exact etiology of her increased sweating but unlikely this is caused the change in Suboxone as the dosage is the same. Advised pt to speak with her PCP about this but pt states the sweating could be due to her increasing the heat settings in her house. Pt is stable on current dose, so will refill her prescription with a refill and have her follow up in person every 3 months for toxassure screening.  -Tox-assure this visit -Refill suboxone -Follow up every 3 months if tox-assure with in reference range.

## 2022-04-28 NOTE — Progress Notes (Signed)
Internal Medicine Clinic Attending  Case discussed with at the time of the visit.  We reviewed the resident's history and exam and pertinent patient test results.  I agree with the assessment, diagnosis, and plan of care documented in the resident's note.

## 2022-05-09 ENCOUNTER — Other Ambulatory Visit: Payer: Self-pay | Admitting: Internal Medicine

## 2022-05-09 MED ORDER — AMPHETAMINE-DEXTROAMPHET ER 15 MG PO CP24: 15.0000 mg | ORAL_CAPSULE | Freq: Two times a day (BID) | ORAL | 0 refills | Status: DC

## 2022-06-01 ENCOUNTER — Ambulatory Visit: Payer: 59 | Admitting: Internal Medicine

## 2022-06-02 ENCOUNTER — Ambulatory Visit (INDEPENDENT_AMBULATORY_CARE_PROVIDER_SITE_OTHER): Payer: 59 | Admitting: Internal Medicine

## 2022-06-02 ENCOUNTER — Encounter: Payer: Self-pay | Admitting: Internal Medicine

## 2022-06-02 VITALS — BP 106/62 | HR 91 | Temp 96.8°F | Ht 63.0 in | Wt 141.0 lb

## 2022-06-02 DIAGNOSIS — Z0001 Encounter for general adult medical examination with abnormal findings: Secondary | ICD-10-CM | POA: Diagnosis not present

## 2022-06-02 MED ORDER — FAMOTIDINE 10 MG PO TABS: 10.0000 mg | ORAL_TABLET | Freq: Every day | ORAL | 1 refills | Status: AC

## 2022-06-02 NOTE — Patient Instructions (Signed)

## 2022-06-02 NOTE — [place] Notes (Signed)
Subjective:    Patient ID: Peggy Lewis, female    DOB: [birth year], 32 y.o.   MRN: [id number]  HPI  Patient presents to clinic today for her annual exam.  Flu: 03/2021 Tetanus: 2019 COVID: Pfizer x 2 Pap smear: 05/2021 Dentist: biannually  Diet: She does eat meat. She consumes fruits and veggies. She does eat fried foods. She drinks mostly water, some soda. Exercise: Walking  Review of Systems     Past Medical History:  Diagnosis Date   Alcohol abuse    Anxiety    Depression    Hepatitis C    Heroin abuse (HCC)    Seizures (HCC)    Septal defect    Stroke [facility])     Current Outpatient Medications  Medication Sig Dispense Refill   amphetamine-dextroamphetamine (ADDERALL XR) 15 MG 24 hr capsule Take 1 capsule by mouth 2 (two) times daily. 60 capsule 0   buprenorphine-naloxone (SUBOXONE) 8-2 mg SUBL SL tablet Place 1 tablet under the tongue in the morning and at bedtime. 60 tablet 1   escitalopram (LEXAPRO) 20 MG tablet Take 1 tablet (20 mg total) by mouth daily. 90 tablet 3   famotidine (PEPCID) 10 MG tablet Take 10 mg by mouth daily.     No current facility-administered medications for this visit.    Allergies  Allergen Reactions   Aspirin Other (See Comments)    GI discomfort   Acetaminophen Rash and Other (See Comments)    Reaction:  GI upset     Family History  Problem Relation Age of Onset   Diabetes Mother    Depression Father    Drug abuse Father    Heart failure Maternal Grandmother    Colon cancer Neg Hx    Liver disease Neg Hx     Social History   Socioeconomic History   Marital status: Married    Spouse name: Not on file   Number of children: 0   Years of education: Not on file   Highest education level: Not on file  Occupational History   Not on file  Tobacco Use   Smoking status: Former    Types: Cigarettes    Quit date: 2021    Years since quitting: 3.1   Smokeless tobacco: Never  Vaping Use   Vaping Use: Every day    Substances: Nicotine, Flavoring  Substance and Sexual Activity   Alcohol use: Not Currently    Comment: no alcohol x 75yr   Drug use: Not Currently    Comment: history of heroin usage, no drugs x 3 yrs   Sexual activity: Yes    Partners: Female  Other Topics Concern   Not on file  Social History Narrative   Lives with her mom, unemployed, no children, she has a girlfriend   Social Determinants of HRadio broadcast assistantStrain: Not on file  Food Insecurity: No Food Insecurity (03/21/2022)   Hunger Vital Sign    Worried About Running Out of Food in the Last Year: Never true    Ran Out of Food in the Last Year: Never true  Transportation Needs: No Transportation Needs (03/21/2022)   PRAPARE - THydrologist(Medical): No    Lack of Transportation (Non-Medical): No  Physical Activity: Not on file  Stress: Not on file  Social Connections: Not on file  Intimate Partner Violence: Not At Risk (03/21/2022)   Humiliation, Afraid, Rape, and Kick questionnaire    Fear of Current  or Ex-Partner: No    Emotionally Abused: No    Physically Abused: No    Sexually Abused: No     Constitutional: Denies fever, malaise, fatigue, headache or abrupt weight changes.  HEENT: Denies eye pain, eye redness, ear pain, ringing in the ears, wax buildup, runny nose, nasal congestion, bloody nose, or sore throat. Respiratory: Denies difficulty breathing, shortness of breath, cough or sputum production.   Cardiovascular: Denies chest pain, chest tightness, palpitations or swelling in the hands or feet.  Gastrointestinal: Pt reports intermittent constipation. Denies abdominal pain, bloating,  diarrhea or blood in the stool.  GU: Denies urgency, frequency, pain with urination, burning sensation, blood in urine, odor or discharge. Musculoskeletal: Denies decrease in range of motion, difficulty with gait, muscle pain or joint pain and swelling.  Skin: Denies redness, rashes, lesions  or ulcercations.  Neurological: Patient reports inattention.  Denies dizziness, difficulty with memory, difficulty with speech or problems with balance and coordination.  Psych: Patient has a history of anxiety and depression.  Denies SI/HI.  No other specific complaints in a complete review of systems (except as listed in HPI above).  Objective:   Physical Exam   BP 106/62 (BP Location: Left Arm, Patient Position: Sitting, Cuff Size: Normal)   Pulse 91   Temp (!) 96.8 F (36 C) (Temporal)   Ht 5' 3"$  (1.6 m)   Wt 141 lb (64 kg)   SpO2 99%   BMI 24.98 kg/m   Wt Readings from Last 3 Encounters:  03/21/22 145 lb 4.8 oz (65.9 kg)  01/06/22 144 lb 14.4 oz (65.7 kg)  12/06/21 145 lb (65.8 kg)    General: Appears her stated age, well developed, well nourished in NAD. Skin: Warm, dry and intact. No rashes noted. HEENT: Head: normal shape and size; Eyes: sclera white, no icterus, conjunctiva pink, PERRLA and EOMs intact;  Neck:  Neck supple, trachea midline. No masses, lumps or thyromegaly present.  Cardiovascular: Normal rate and rhythm. S1,S2 noted.  No murmur, rubs or gallops noted. No JVD or BLE edema.  Pulmonary/Chest: Normal effort and positive vesicular breath sounds. No respiratory distress. No wheezes, rales or ronchi noted.  Abdomen: Normal bowel sounds. Musculoskeletal: Strength 5/5 BUE/BLE. No difficulty with gait.  Neurological: Alert and oriented. Cranial nerves II-XII grossly intact. Coordination normal.  Psychiatric: Mood and affect normal. Behavior is normal. Judgment and thought content normal.    BMET    Component Value Date/Time   NA 133 (L) 12/06/2021 1518   NA 142 11/09/2013 0119   K 4.0 12/06/2021 1518   K 2.9 (L) 11/09/2013 0119   CL 99 12/06/2021 1518   CL 107 11/09/2013 0119   CO2 24 12/06/2021 1518   CO2 22 11/09/2013 0119   GLUCOSE 90 12/06/2021 1518   GLUCOSE 78 11/09/2013 0119   BUN 12 12/06/2021 1518   BUN 6 (L) 11/09/2013 0119   CREATININE  0.67 12/06/2021 1518   CALCIUM 9.3 12/06/2021 1518   CALCIUM 8.7 11/09/2013 0119   GFRNONAA >60 10/17/2014 0523   GFRNONAA >60 11/09/2013 0119   GFRAA >60 10/17/2014 0523   GFRAA >60 11/09/2013 0119    Lipid Panel     Component Value Date/Time   CHOL 171 06/04/2021 1341   TRIG 76 06/04/2021 1341   HDL 64 06/04/2021 1341   CHOLHDL 2.7 06/04/2021 1341   LDLCALC 90 06/04/2021 1341    CBC    Component Value Date/Time   WBC 5.1 12/06/2021 1518   RBC 4.35  12/06/2021 1518   HGB 9.0 (L) 12/06/2021 1518   HGB 10.4 (L) 11/09/2013 1259   HCT 30.9 (L) 12/06/2021 1518   HCT 44.4 11/09/2013 0119   PLT 378 12/06/2021 1518   PLT 390 11/09/2013 0119   MCV 71.0 (L) 12/06/2021 1518   MCV 90 11/09/2013 0119   MCH 20.7 (L) 12/06/2021 1518   MCHC 29.1 (L) 12/06/2021 1518   RDW 14.8 12/06/2021 1518   RDW 13.6 11/09/2013 0119   LYMPHSABS 2.9 10/16/2014 1050   LYMPHSABS 2.2 04/06/2013 1943   MONOABS 0.5 10/16/2014 1050   MONOABS 0.4 04/06/2013 1943   EOSABS 0.5 10/16/2014 1050   EOSABS 0.4 04/06/2013 1943   BASOSABS 0.1 10/16/2014 1050   BASOSABS 0.0 04/06/2013 1943    Hgb A1C No results found for: "HGBA1C"         Assessment & Plan:   Preventative Health Maintenance:  Encouraged her to get a flu shot in the fall Tetanus UTD per her report Encouraged her to get her COVID booster Pap smear UTD Encouraged her to consume a balanced diet and exercise regimen Advised her to see a dentist annually We will check CBC, c-Met and lipid profile today  RTC in 6 months, follow-up chronic conditions Webb Silversmith, NP

## 2022-06-03 LAB — LIPID PANEL
Cholesterol: 186 mg/dL (ref <200)
HDL: 66 mg/dL (ref >=50)
LDL Cholesterol (Calc): 105 mg/dL (calc) — ABNORMAL HIGH
Non-HDL Cholesterol (Calc): 120 mg/dL (calc) (ref <130)
Total CHOL/HDL Ratio: 2.8 (calc) (ref <5.0)
Triglycerides: 67 mg/dL (ref <150)

## 2022-06-03 LAB — COMPLETE METABOLIC PANEL WITH GFR
AG Ratio: 1.5 (calc) (ref 1.0–2.5)
ALT: 19 U/L (ref 6–29)
AST: 21 U/L (ref 10–30)
Albumin: 4.3 g/dL (ref 3.6–5.1)
Alkaline phosphatase (APISO): 50 U/L (ref 31–125)
BUN: 10 mg/dL (ref 7–25)
CO2: 26 mmol/L (ref 20–32)
Calcium: 9.2 mg/dL (ref 8.6–10.2)
Chloride: 104 mmol/L (ref 98–110)
Creat: 0.73 mg/dL (ref 0.50–0.97)
Globulin: 2.8 g/dL (calc) (ref 1.9–3.7)
Glucose, Bld: 99 mg/dL (ref 65–99)
Potassium: 4.1 mmol/L (ref 3.5–5.3)
Sodium: 139 mmol/L (ref 135–146)
Total Bilirubin: 0.2 mg/dL (ref 0.2–1.2)
Total Protein: 7.1 g/dL (ref 6.1–8.1)
eGFR: 113 mL/min/{1.73_m2} (ref >=60)

## 2022-06-03 LAB — CBC
HCT: 37.8 % (ref 35.0–45.0)
Hemoglobin: 12.2 g/dL (ref 11.7–15.5)
MCH: 27.6 pg (ref 27.0–33.0)
MCHC: 32.3 g/dL (ref 32.0–36.0)
MCV: 85.5 fL (ref 80.0–100.0)
MPV: 9.5 fL (ref 7.5–12.5)
Platelets: 382 10*3/uL (ref 140–400)
RBC: 4.42 10*6/uL (ref 3.80–5.10)
RDW: 12.7 % (ref 11.0–15.0)
WBC: 5.3 10*3/uL (ref 3.8–10.8)

## 2022-06-06 ENCOUNTER — Other Ambulatory Visit: Payer: Self-pay | Admitting: Internal Medicine

## 2022-06-07 MED ORDER — AMPHETAMINE-DEXTROAMPHET ER 15 MG PO CP24: 15.0000 mg | ORAL_CAPSULE | Freq: Two times a day (BID) | ORAL | 0 refills | Status: DC

## 2022-06-12 ENCOUNTER — Other Ambulatory Visit: Payer: Self-pay | Admitting: Internal Medicine

## 2022-06-12 DIAGNOSIS — F1190 Opioid use, unspecified, uncomplicated: Secondary | ICD-10-CM

## 2022-06-14 NOTE — Telephone Encounter (Signed)
Pt calling back to f/u with her refill request. Pt states she called in on 06/12/2022 and has not been able to get the following medication.   buprenorphine-naloxone (SUBOXONE) 8-2 mg SUBL SL tablet   [place], [place]

## 2022-07-06 ENCOUNTER — Other Ambulatory Visit: Payer: Self-pay | Admitting: Internal Medicine

## 2022-07-06 MED ORDER — AMPHETAMINE-DEXTROAMPHET ER 15 MG PO CP24: 15.0000 mg | ORAL_CAPSULE | Freq: Two times a day (BID) | ORAL | 0 refills | Status: DC

## 2022-07-11 ENCOUNTER — Encounter: Payer: Self-pay | Admitting: Internal Medicine

## 2022-07-11 ENCOUNTER — Ambulatory Visit (INDEPENDENT_AMBULATORY_CARE_PROVIDER_SITE_OTHER): Payer: 59 | Admitting: Internal Medicine

## 2022-07-11 DIAGNOSIS — Z8619 Personal history of other infectious and parasitic diseases: Secondary | ICD-10-CM

## 2022-07-11 DIAGNOSIS — F1190 Opioid use, unspecified, uncomplicated: Secondary | ICD-10-CM | POA: Diagnosis not present

## 2022-07-11 MED ORDER — BUPRENORPHINE HCL-NALOXONE HCL 8-2 MG SL SUBL: SUBLINGUAL_TABLET | SUBLINGUAL | 0 refills | Status: DC

## 2022-07-11 NOTE — [place] Notes (Unsigned)
   CC: OUD  HPI:Peggy Lewis is a 32 y.o. female who presents for evaluation of OUD. Please see individual problem based A/P for details.  Oud, etoh, ADD, GERD, ida, thrombocytosis, hep c  Opioid use disorder Patient was recently switched from suboxone strips to tablets. She switched due to price and availability of medicine. She thinks the strips lasted longer. She notes sweating, restless legs, and feeling uncomfortable in mid afternoon. She is currently taking full tablet in the morning, half tablet between 12-4, and another half tablet after dinner. This helps control symptoms during the day, but she will often wake up around 4-5AM with more symptoms of withdrawal.  Patient is otherwise controlled and has not relapsed since starting medicine.  PDMP appropriate. Positive for amphetamines and citalopram which are prescribed medicines.  Will increase suboxone to 1 8mg  tablet in the morning, 1/2 tablet in the afternoon, and 1 full tablet in the evening again. Sent in 21 tablets which patient has already filled, however, she will need an additional 14 tablets for the full 2 week duration. Will send in second Rx 14 tabs to be picked up and started on 4/1. Will see patient in person in 2 weeks to evaluate efficacy of this regimen. If doing well, can space out to full month again.  Collecting utox today as well.    History of hepatitis C Patient has had previous positive ab. Most recent labwork with levels below threshold. She did not receive treatment for this and never followed with ID. Will discuss with ID to see if she requires treatment or can continue monitoring.    Depression, PHQ-9: Based on the patients  [place] Visit from 03/21/2022 in [place]  PHQ-9 Total Score 0      score we have .  Past Medical History:  Diagnosis Date   Alcohol abuse    Anxiety    Depression    Hepatitis C    Heroin abuse ([place])    Seizures ([place])    Septal  defect    Stroke [facility])    Review of Systems:   See HPI  Physical Exam: Vitals:   07/11/22 1523  BP: 110/60  Pulse: 72  Temp: 98.5 F (36.9 C)  TempSrc: Oral  SpO2: 100%  Weight: 146 lb 9.6 oz (66.5 kg)  Height: 5\' 3"  (1.6 m)     General: NAD HEENT: Conjunctiva nl , antiicteric sclerae, moist mucous membranes, no exudate or erythema Cardiovascular: Normal rate, regular rhythm.  No murmurs, rubs, or gallops Pulmonary : Equal breath sounds, No wheezes, rales, or rhonchi Abdominal: soft, nontender,  bowel sounds present Ext: No edema in lower extremities, no tenderness to palpation of lower extremities.   Assessment & Plan:   See Encounters Tab for problem based charting.  Patient discussed with Dr.  Saverio Danker

## 2022-07-11 NOTE — Patient Instructions (Addendum)
Dear Peggy Lewis,  Thank you for trusting [place] with your care.   We discussed your suboxone. I am going to increase the frequency of your suboxone. Please take 1 tablet in the morning, 1/2 tablet in the afternoon, and 1 tablet in the evening. I am sending in a 2 week supply to see how this works for you. We would like to see you back in the office in 2 weeks to make any changes as necessary. If this is working well for you, we can send in a longer duration again.

## 2022-07-12 NOTE — Assessment & Plan Note (Addendum)
Patient has had previous positive ab. Most recent labwork with levels below threshold. She did not receive treatment for this.

## 2022-07-12 NOTE — Assessment & Plan Note (Signed)
Patient was recently switched from suboxone strips to tablets. She switched due to price and availability of medicine. She thinks the strips lasted longer. She notes sweating, restless legs, and feeling uncomfortable in mid afternoon. She is currently taking full tablet in the morning, half tablet between 12-4, and another half tablet after dinner. This helps control symptoms during the day, but she will often wake up around 4-5AM with more symptoms of withdrawal.  Patient is otherwise controlled and has not relapsed since starting medicine.  PDMP appropriate. Positive for amphetamines and citalopram which are prescribed medicines.  Will increase suboxone to 1 8mg  tablet in the morning, 1/2 tablet in the afternoon, and 1 full tablet in the evening again. Sent in 21 tablets which patient has already filled, however, she will need an additional 14 tablets for the full 2 week duration. Will send in second Rx 14 tabs to be picked up and started on 4/1. Will see patient in person in 2 weeks to evaluate efficacy of this regimen. If doing well, can space out to full month again.  Collecting utox today as well.

## 2022-07-13 ENCOUNTER — Encounter: Payer: Self-pay | Admitting: Internal Medicine

## 2022-07-13 MED ORDER — BUPRENORPHINE HCL-NALOXONE HCL 8-2 MG SL SUBL: SUBLINGUAL_TABLET | SUBLINGUAL | 0 refills | Status: DC

## 2022-07-14 NOTE — [place] Notes (Signed)
Internal Medicine Clinic Attending  Case discussed with Dr. Elliot Gurney  At the time of the visit.  We reviewed the resident's history and exam and pertinent patient test results.  I agree with the assessment, diagnosis, and plan of care documented in the resident's note. Hepatitis C Ab positive, recent RNA negative--consistent with cleared infection.

## 2022-07-15 LAB — TOXASSURE SELECT,+ANTIDEPR,UR

## 2022-07-21 ENCOUNTER — Other Ambulatory Visit: Payer: Self-pay

## 2022-07-21 DIAGNOSIS — F1190 Opioid use, unspecified, uncomplicated: Secondary | ICD-10-CM

## 2022-07-21 NOTE — Telephone Encounter (Signed)
ERROR

## 2022-07-28 ENCOUNTER — Ambulatory Visit (INDEPENDENT_AMBULATORY_CARE_PROVIDER_SITE_OTHER): Payer: 59 | Admitting: Student

## 2022-07-28 ENCOUNTER — Encounter: Payer: Self-pay | Admitting: Student

## 2022-07-28 VITALS — BP 101/71 | HR 73 | Temp 98.3°F | Ht 63.0 in | Wt 148.5 lb

## 2022-07-28 DIAGNOSIS — F1190 Opioid use, unspecified, uncomplicated: Secondary | ICD-10-CM | POA: Diagnosis not present

## 2022-07-28 MED ORDER — BUPRENORPHINE HCL-NALOXONE HCL 8-2 MG SL SUBL
SUBLINGUAL_TABLET | SUBLINGUAL | 1 refills | Status: DC
Start: 2022-07-28 — End: 2022-08-03

## 2022-07-28 NOTE — Assessment & Plan Note (Addendum)
Patient here for 2-week OUD follow-up after her Suboxone was increased during last office visit due to withdrawal symptoms. Her prescription was increased from 2 tablets daily to 2.5 tablets daily. Today, patient reports that the current dose has been working for her. She denies any withdrawal symptoms or cravings. Now that we have a stable dose for patient, will plan to space out her appointments.  PDMP reviewed and is appropriate Tox screen during last office visit showed expected results.  Plan: -Refill Suboxone 8-2 mg SL tabs, 1 tab in the morning, 1/2 in the afternoon and 1 tab at night, #70 tabs for 30-day supply with 1 refill -Follow-up via televisit for refill in 4 weeks

## 2022-07-28 NOTE — Progress Notes (Signed)
Internal Medicine Clinic Attending  Case discussed with Dr. Amponsah  At the time of the visit.  We reviewed the resident's history and exam and pertinent patient test results.  I agree with the assessment, diagnosis, and plan of care documented in the resident's note.  

## 2022-07-28 NOTE — Patient Instructions (Addendum)
Thank you, Peggy Lewis for allowing [place] to provide your care today. Today, we discussed your Suboxone therapy.  I am glad that the current dose is working for you.  Continue taking your Suboxone 8-2 mg, take 1 tablet in the morning, 1/2 tablet in the afternoon and 1 tablet at night.  My Chart Access: [email or web address]  Please follow-up in 1 month via televisit  Please make sure to arrive 15 minutes prior to your next appointment. If you arrive late, you may be asked to reschedule.    We look forward to seeing you next time. Please call our clinic at [phone] if you have any questions or concerns. The best time to call is Monday-Friday from 9am-4pm, but there is someone available 24/7. If after hours or the weekend, call the main hospital number and ask for the Internal Medicine Resident On-Call. If you need medication refills, please notify your pharmacy one week in advance and they will send [place] a request.   Thank you for letting us take part in your care. Wishing you the best!  Steffanie Rainwater, MD 07/28/2022, 4:18 PM IM Resident, PGY-3 [place] Heck 41:10

## 2022-07-28 NOTE — [place] Notes (Signed)
   CC: OUD follow-up  HPI:  [name] is a 32 y.o. female with PMH as below who presents to clinic for an OUD follow-up. Please see problem based charting for evaluation, assessment and plan.  Past Medical History:  Diagnosis Date   Alcohol abuse    Anxiety    Depression    Hepatitis C    Heroin abuse (HCC)    Seizures (HCC)    Septal defect    Stroke (HCC)     Review of Systems:  Constitutional: Negative for cravings or withdrawal symptoms HEENT: Positive for occasional dry mouth Eyes: Negative for visual changes Abdomen: Negative for abdominal pain, constipation or diarrhea Neuro: Negative for headache or weakness  Physical Exam: General: Pleasant, well-appearing middle-aged woman.  No acute distress. Cardiac: RRR. No murmurs, rubs or gallops. No LE edema Respiratory: Lungs CTAB. No wheezing or crackles. Abdominal: Soft, symmetric and non tender. Normal BS. Skin: Warm, dry and intact without rashes or lesions Neuro: A&O x 3. Moves all extremities Psych: Appropriate mood and affect.  Vitals:   07/28/22 1600 07/28/22 1621  BP: 97/69 101/71  Pulse: 77 73  Temp: 98.3 F (36.8 C)   TempSrc: Oral   SpO2: 100%   Weight: 148 lb 8 oz (67.4 kg)   Height: 5\' 3"  (1.6 m)     Assessment & Plan:   Opioid use disorder Patient here for 2-week OUD follow-up after her Suboxone was increased during last office visit due to withdrawal symptoms. Her prescription was increased from 2 tablets daily to 2.5 tablets daily. Today, patient reports that the current dose has been working for her. She denies any withdrawal symptoms or cravings. Now that we have a stable dose for patient, will plan to space out her appointments.  PDMP reviewed and is appropriate Tox screen during last office visit showed expected results.  Plan: -Refill Suboxone 8-2 mg SL tabs, 1 tab in the morning, 1/2 in the afternoon and 1 tab at night, #70 tabs for 30-day supply with 1 refill -Follow-up via  televisit for refill in 4 weeks   See Encounters Tab for problem based charting.  Patient discussed with Dr.  Avis Epley, MD, MPH

## 2022-08-03 ENCOUNTER — Other Ambulatory Visit: Payer: Self-pay

## 2022-08-03 ENCOUNTER — Other Ambulatory Visit: Payer: Self-pay | Admitting: Internal Medicine

## 2022-08-03 DIAGNOSIS — F1190 Opioid use, unspecified, uncomplicated: Secondary | ICD-10-CM

## 2022-08-03 MED ORDER — BUPRENORPHINE HCL-NALOXONE HCL 8-2 MG SL SUBL
SUBLINGUAL_TABLET | SUBLINGUAL | 1 refills | Status: DC
Start: 2022-08-03 — End: 2022-09-20

## 2022-08-04 MED ORDER — AMPHETAMINE-DEXTROAMPHET ER 15 MG PO CP24: 15.0000 mg | ORAL_CAPSULE | Freq: Two times a day (BID) | ORAL | 0 refills | Status: DC

## 2022-09-01 ENCOUNTER — Other Ambulatory Visit: Payer: Self-pay | Admitting: Internal Medicine

## 2022-09-02 MED ORDER — AMPHETAMINE-DEXTROAMPHET ER 15 MG PO CP24: 15.0000 mg | ORAL_CAPSULE | Freq: Two times a day (BID) | ORAL | 0 refills | Status: DC

## 2022-09-11 ENCOUNTER — Encounter: Payer: Self-pay | Admitting: Internal Medicine

## 2022-09-20 ENCOUNTER — Encounter: Payer: Self-pay | Admitting: Student

## 2022-09-20 ENCOUNTER — Ambulatory Visit (INDEPENDENT_AMBULATORY_CARE_PROVIDER_SITE_OTHER): Payer: 59 | Admitting: Student

## 2022-09-20 DIAGNOSIS — F1190 Opioid use, unspecified, uncomplicated: Secondary | ICD-10-CM

## 2022-09-20 MED ORDER — BUPRENORPHINE HCL-NALOXONE HCL 8-2 MG SL SUBL
SUBLINGUAL_TABLET | SUBLINGUAL | 0 refills | Status: DC
Start: 2022-09-27 — End: 2022-09-28

## 2022-09-20 NOTE — [place] Notes (Signed)
  [facility] Internal Medicine Residency Telephone Encounter Continuity Care Appointment  HPI:  This telephone encounter was created for [name] on 09/20/2022 for the following purpose/cc OUD treatment.   Past Medical History:  Past Medical History:  Diagnosis Date   Alcohol abuse    Anxiety    Depression    Hepatitis C    Heroin abuse (HCC)    Seizures (HCC)    Septal defect    Stroke (HCC)      ROS:     Assessment / Plan / Recommendations:  Opioid use disorder Pateint with recent history of withdrawal symptoms prompting increase in her Suboxone dose to 2.5 tablets daily during last clinic visit 07/28/2022. Toxassure results revieved. Patient takes first a pill, 3:30 1/2 pill, and a whole pill before bed. PHQ-2 score and GAD-7 score of 3. Patient currently on escitalopram 20 mg, reports that her symptoms have been in remission.  Patient denies new or increased cravings or signs or symptoms of withdrawal. PDMP reviewed and patient has received appropriate refills from our clinic regarding her Suboxone. Patient will received 70 tablets to be filled on 09/27/2022 for 28 days to be taken 1 tab in the morning, 1/2 in the afternoon and 1 tab at night. - Refill today as above - Patient is to call for the next two refills - Instructed to call back in ~1 month to make an in-person follow up 3 months from now  (September of 2024.)   As always, pt is advised that if symptoms worsen or new symptoms arise, they should go to an urgent care facility or to to ER for further evaluation.   Consent and Medical Decision Making:  Patient discussed with Dr. Antony Contras This is a telephone encounter between Peggy Lewis and Peggy Lewis ,MD on 09/20/2022 for OUD treatment. The visit was conducted with the patient located at home and Peggy Lewis ,MD at [facility]. The patient's identity was confirmed using their DOB and current address. The patient has consented to being evaluated through a  telephone encounter and understands the associated risks (an examination cannot be done and the patient may need to come in for an appointment) / benefits (allows the patient to remain at home, decreasing exposure to coronavirus). I personally spent 30 minutes on medical discussion.

## 2022-09-20 NOTE — Assessment & Plan Note (Signed)
Pateint with recent history of withdrawal symptoms prompting increase in her Suboxone dose to 2.5 tablets daily during last clinic visit 07/28/2022. Toxassure results revieved. Patient takes first a pill, 3:30 1/2 pill, and a whole pill before bed. PHQ-2 score and GAD-7 score of 3. Patient currently on escitalopram 20 mg, reports that her symptoms have been in remission.  Patient denies new or increased cravings or signs or symptoms of withdrawal. PDMP reviewed and patient has received appropriate refills from our clinic regarding her Suboxone. Patient will received 70 tablets to be filled on 09/27/2022 for 28 days to be taken 1 tab in the morning, 1/2 in the afternoon and 1 tab at night. - Refill today as above - Patient is to call for the next two refills - Instructed to call back in ~1 month to make an in-person follow up 3 months from now

## 2022-09-21 NOTE — [place] Notes (Signed)
Internal Medicine Clinic Attending  Case discussed with Dr. Gomez-Caraballo  At the time of the visit.  We reviewed the resident's history and exam and pertinent patient test results.  I agree with the assessment, diagnosis, and plan of care documented in the resident's note.  

## 2022-09-28 ENCOUNTER — Other Ambulatory Visit: Payer: Self-pay | Admitting: Internal Medicine

## 2022-09-28 ENCOUNTER — Other Ambulatory Visit: Payer: Self-pay | Admitting: *Deleted

## 2022-09-28 DIAGNOSIS — F1190 Opioid use, unspecified, uncomplicated: Secondary | ICD-10-CM

## 2022-09-28 MED ORDER — BUPRENORPHINE HCL-NALOXONE HCL 8-2 MG SL SUBL
SUBLINGUAL_TABLET | SUBLINGUAL | 0 refills | Status: DC
Start: 2022-10-26 — End: 2022-12-15

## 2022-09-28 NOTE — Telephone Encounter (Signed)
Patient was unablr e to pick up prescription for Suboxne that was written by Dr. Lily Kocher.  Insurance did not recognize her DEA number.  Patient states that this has happened before.  Was able to fill an old prescription for the Suboxone that was written by Dr. Welton Flakes.  Would like to go ahead an get a new prescription sent I for next month so hat she will not have to go without her Suboxone.  Will forward message to the Yellow Team to have a new prescription done.

## 2022-10-03 ENCOUNTER — Other Ambulatory Visit: Payer: Self-pay | Admitting: Internal Medicine

## 2022-10-03 MED ORDER — AMPHETAMINE-DEXTROAMPHET ER 15 MG PO CP24: 15.0000 mg | ORAL_CAPSULE | Freq: Two times a day (BID) | ORAL | 0 refills | Status: DC

## 2022-11-01 ENCOUNTER — Other Ambulatory Visit: Payer: Self-pay | Admitting: Internal Medicine

## 2022-11-01 MED ORDER — AMPHETAMINE-DEXTROAMPHET ER 15 MG PO CP24: 15.0000 mg | ORAL_CAPSULE | Freq: Two times a day (BID) | ORAL | 0 refills | Status: DC

## 2022-11-30 ENCOUNTER — Other Ambulatory Visit: Payer: Self-pay | Admitting: Internal Medicine

## 2022-12-01 ENCOUNTER — Encounter: Payer: Self-pay | Admitting: Internal Medicine

## 2022-12-01 ENCOUNTER — Other Ambulatory Visit: Payer: Self-pay | Admitting: Internal Medicine

## 2022-12-01 NOTE — Telephone Encounter (Signed)
Pt is calling to request a partial refill refill if she able to schedule appt on 12/13/22. Pt states that she is unable to go with out her medication that long.

## 2022-12-01 NOTE — Telephone Encounter (Signed)
Medication Refill - Medication: amphetamine-dextroamphetamine (ADDERALL XR) 15 MG 24 hr capsule   Has the patient contacted their pharmacy? Yes.   amphetamine-dextroamphetamine   Preferred Pharmacy (with phone number or street name):  [facility] [address] - [place], [place] - [address] AT [address] Phone: [phone]  Fax: [phone]      Has the patient been seen for an appointment in the last year OR does the patient have an upcoming appointment? Yes.    The patient called in stating she had no idea that she needed to make an appt to get refills on her medication. She says she will be out as of tomorrow and she doesn't have an appt until Monday and is on the wait list virtually with her provider. She can do anytime tomorrow if she can be worked in. This is a daily medication and she doesn't want to be without it for any time. She also left her provider a message about this on my chart. Please assist patient further

## 2022-12-02 MED ORDER — AMPHETAMINE-DEXTROAMPHET ER 15 MG PO CP24: 15.0000 mg | ORAL_CAPSULE | Freq: Two times a day (BID) | ORAL | 0 refills | Status: DC

## 2022-12-05 ENCOUNTER — Telehealth: Payer: 59 | Admitting: Internal Medicine

## 2022-12-13 ENCOUNTER — Encounter: Payer: Self-pay | Admitting: Internal Medicine

## 2022-12-13 ENCOUNTER — Ambulatory Visit: Payer: 59 | Admitting: Internal Medicine

## 2022-12-13 VITALS — BP 112/64 | HR 95 | Temp 96.6°F | Wt 140.0 lb

## 2022-12-13 DIAGNOSIS — F988 Other specified behavioral and emotional disorders with onset usually occurring in childhood and adolescence: Secondary | ICD-10-CM | POA: Diagnosis not present

## 2022-12-13 DIAGNOSIS — F32A Depression, unspecified: Secondary | ICD-10-CM

## 2022-12-13 DIAGNOSIS — F1011 Alcohol abuse, in remission: Secondary | ICD-10-CM

## 2022-12-13 DIAGNOSIS — E7800 Pure hypercholesterolemia, unspecified: Secondary | ICD-10-CM | POA: Insufficient documentation

## 2022-12-13 DIAGNOSIS — F1191 Opioid use, unspecified, in remission: Secondary | ICD-10-CM

## 2022-12-13 DIAGNOSIS — F419 Anxiety disorder, unspecified: Secondary | ICD-10-CM

## 2022-12-13 DIAGNOSIS — K219 Gastro-esophageal reflux disease without esophagitis: Secondary | ICD-10-CM | POA: Diagnosis not present

## 2022-12-13 MED ORDER — ESCITALOPRAM OXALATE 10 MG PO TABS: 10.0000 mg | ORAL_TABLET | Freq: Every day | ORAL | 1 refills | Status: DC

## 2022-12-13 NOTE — Assessment & Plan Note (Signed)
Encourage low-fat diet

## 2022-12-13 NOTE — Patient Instructions (Signed)
Managing Anxiety, Adult After being diagnosed with anxiety, you may be relieved to know why you have felt or behaved a certain way. You may also feel overwhelmed about the treatment ahead and what it will mean for your life. With care and support, you can manage your anxiety. How to manage lifestyle changes Understanding the difference between stress and anxiety Although stress can play a role in anxiety, it is not the same as anxiety. Stress is your body's reaction to life changes and events, both good and bad. Stress is often caused by something external, such as a deadline, test, or competition. It normally goes away after the event has ended and will last just a few hours. But, stress can be ongoing and can lead to more than just stress. Anxiety is caused by something internal, such as imagining a terrible outcome or worrying that something will go wrong that will greatly upset you. Anxiety often does not go away even after the event is over, and it can become a long-term (chronic) worry. Lowering stress and anxiety Talk with your health care provider or a counselor to learn more about lowering anxiety and stress. They may suggest tension-reduction techniques, such as: Music. Spend time creating or listening to music that you enjoy and that inspires you. Mindfulness-based meditation. Practice being aware of your normal breaths while not trying to control your breathing. It can be done while sitting or walking. Centering prayer. Focus on a word, phrase, or sacred image that means something to you and brings you peace. Deep breathing. Expand your stomach and inhale slowly through your nose. Hold your breath for 3-5 seconds. Then breathe out slowly, letting your stomach muscles relax. Self-talk. Learn to notice and spot thought patterns that lead to anxiety reactions. Change those patterns to thoughts that feel peaceful. Muscle relaxation. Take time to tense muscles and then relax them. Choose a  tension-reduction technique that fits your lifestyle and personality. These techniques take time and practice. Set aside 5-15 minutes a day to do them. Specialized therapists can offer counseling and training in these techniques. The training to help with anxiety may be covered by some insurance plans. Other things you can do to manage stress and anxiety include: Keeping a stress diary. This can help you learn what triggers your reaction and then learn ways to manage your response. Thinking about how you react to certain situations. You may not be able to control everything, but you can control your response. Making time for activities that help you relax and not feeling guilty about spending your time in this way. Doing visual imagery. This involves imagining or creating mental pictures to help you relax. Practicing yoga. Through yoga poses, you can lower tension and relax.  Medicines Medicines for anxiety include: Antidepressant medicines. These are usually prescribed for long-term daily control. Anti-anxiety medicines. These may be added in severe cases, especially when panic attacks occur. When used together, medicines, psychotherapy, and tension-reduction techniques may be the most effective treatment. Relationships Relationships can play a big part in helping you recover. Spend more time connecting with trusted friends and family members. Think about going to couples counseling if you have a partner, taking family education classes, or going to family therapy. Therapy can help you and others better understand your anxiety. How to recognize changes in your anxiety Everyone responds differently to treatment for anxiety. Recovery from anxiety happens when symptoms lessen and stop interfering with your daily life at home or work. This may mean that you   will start to: Have better concentration and focus. Worry will interfere less in your daily thinking. Sleep better. Be less irritable. Have more  energy. Have improved memory. Try to recognize when your condition is getting worse. Contact your provider if your symptoms interfere with home or work and you feel like your condition is not improving. Follow these instructions at home: Activity Exercise. Adults should: Exercise for at least 150 minutes each week. The exercise should increase your heart rate and make you sweat (moderate-intensity exercise). Do strengthening exercises at least twice a week. Get the right amount and quality of sleep. Most adults need 7-9 hours of sleep each night. Lifestyle  Eat a healthy diet that includes plenty of vegetables, fruits, whole grains, low-fat dairy products, and lean protein. Do not eat a lot of foods that are high in fats, added sugars, or salt (sodium). Make choices that simplify your life. Do not use any products that contain nicotine or tobacco. These products include cigarettes, chewing tobacco, and vaping devices, such as e-cigarettes. If you need help quitting, ask your provider. Avoid caffeine, alcohol, and certain over-the-counter cold medicines. These may make you feel worse. Ask your pharmacist which medicines to avoid. General instructions Take over-the-counter and prescription medicines only as told by your provider. Keep all follow-up visits. This is to make sure you are managing your anxiety well or if you need more support. Where to find support You can get help and support from: Self-help groups. Online and community organizations. A trusted spiritual leader. Couples counseling. Family education classes. Family therapy. Where to find more information You may find that joining a support group helps you deal with your anxiety. The following sources can help you find counselors or support groups near you: Mental Health America: mentalhealthamerica.net Anxiety and Depression Association of America (ADAA): adaa.org National Alliance on Mental Illness (NAMI): nami.org Contact  a health care provider if: You have a hard time staying focused or finishing tasks. You spend many hours a day feeling worried about everyday life. You are very tired because you cannot stop worrying. You start to have headaches or often feel tense. You have chronic nausea or diarrhea. Get help right away if: Your heart feels like it is racing. You have shortness of breath. You have thoughts of hurting yourself or others. Get help right away if you feel like you may hurt yourself or others, or have thoughts about taking your own life. Go to your nearest emergency room or: Call 911. Call the National Suicide Prevention Lifeline at 1-800-273-8255 or 988. This is open 24 hours a day. Text the Crisis Text Line at 741741. This information is not intended to replace advice given to you by your health care provider. Make sure you discuss any questions you have with your health care provider. Document Revised: 01/11/2022 Document Reviewed: 07/26/2020 Elsevier Patient Education  2024 Elsevier Inc.  

## 2022-12-13 NOTE — Assessment & Plan Note (Signed)
Continue famotidine Will monitor

## 2022-12-13 NOTE — Assessment & Plan Note (Signed)
Continue Suboxone 

## 2022-12-13 NOTE — Assessment & Plan Note (Signed)
In remission Support offered

## 2022-12-13 NOTE — Assessment & Plan Note (Signed)
Continue Adderall 15 mg XR

## 2022-12-13 NOTE — Progress Notes (Signed)
Subjective:    Patient ID: Peggy Lewis, female    DOB: May 06, 1990, 32 y.o.   MRN: [id number]  HPI  Patient presents to clinic today for 17-month follow-up of chronic conditions.  History of heroin abuse: Managed with suboxone.  She follows with suboxone clinic.  History of alcohol abuse: In remission.  She has been sober for 5 years.  Anxiety and depression: Chronic, managed on escitalopram.  She does feel like her dose needs to be adjusted at this time.  She is not currently seeing a therapist.  She denies SI/HI.  ADD: She reports difficulty focusing, lack of motivation.  She is taking adderall as prescribed.  She does not follow with psychiatry.  GERD: She is not sure what triggers this.  She denies breakthrough on famotidine.  There is no upper GI on file.  HLD: Her last LDL was 105, triglycerides 67, 05/2022.  She is not taking any cholesterol-lowering medication at this time.  She does not consume a low-fat diet.  Review of Systems     Past Medical History:  Diagnosis Date   Alcohol abuse    Anxiety    Depression    Hepatitis C    Heroin abuse (HCC)    Seizures (HCC)    Septal defect    Stroke [facility])     Current Outpatient Medications  Medication Sig Dispense Refill   amphetamine-dextroamphetamine (ADDERALL XR) 15 MG 24 hr capsule Take 1 capsule by mouth 2 (two) times daily. 60 capsule 0   buprenorphine-naloxone (SUBOXONE) 8-2 mg SUBL SL tablet Take 1 tablet in the morning, 1/2 tablet in the afternoon, and 1 tablet at night 70 tablet 0   escitalopram (LEXAPRO) 20 MG tablet Take 1 tablet (20 mg total) by mouth daily. 90 tablet 3   famotidine (PEPCID) 10 MG tablet Take 1 tablet (10 mg total) by mouth daily. 90 tablet 1   No current facility-administered medications for this visit.    Allergies  Allergen Reactions   Aspirin Other (See Comments)    GI discomfort   Acetaminophen Rash and Other (See Comments)    Reaction:  GI upset     Family History  Problem  Relation Age of Onset   Diabetes Mother    Depression Father    Drug abuse Father    Heart failure Maternal Grandmother    Colon cancer Neg Hx    Liver disease Neg Hx     Social History   Socioeconomic History   Marital status: Married    Spouse name: Not on file   Number of children: 0   Years of education: Not on file   Highest education level: GED or equivalent  Occupational History   Not on file  Tobacco Use   Smoking status: Former    Current packs/day: 0.00    Types: Cigarettes    Quit date: 2021    Years since quitting: 3.6   Smokeless tobacco: Never  Vaping Use   Vaping status: Every Day   Substances: Nicotine, Flavoring  Substance and Sexual Activity   Alcohol use: Not Currently    Comment: no alcohol x 63yrs   Drug use: Not Currently    Comment: history of heroin usage, no drugs x 3 yrs   Sexual activity: Yes    Partners: Female  Other Topics Concern   Not on file  Social History Narrative   Lives with her mom, unemployed, no children, she has a girlfriend   Social Determinants of  Health   Financial Resource Strain: Medium Risk (12/09/2022)   Overall Financial Resource Strain (CARDIA)    Difficulty of Paying Living Expenses: Somewhat hard  Food Insecurity: No Food Insecurity (12/09/2022)   Hunger Vital Sign    Worried About Running Out of Food in the Last Year: Never true    Ran Out of Food in the Last Year: Never true  Transportation Needs: No Transportation Needs (12/09/2022)   PRAPARE - Administrator, Civil Service (Medical): No    Lack of Transportation (Non-Medical): No  Physical Activity: Sufficiently Active (12/09/2022)   Exercise Vital Sign    Days of Exercise per Week: 6 days    Minutes of Exercise per Session: 150+ min  Stress: Stress Concern Present (12/09/2022)   [employer] of Occupational Health - Occupational Stress Questionnaire    Feeling of Stress : Rather much  Social Connections: Unknown (12/09/2022)   Social  Connection and Isolation Panel [NHANES]    Frequency of Communication with Friends and Family: More than three times a week    Frequency of Social Gatherings with Friends and Family: More than three times a week    Attends Religious Services: Patient declined    Database administrator or Organizations: No    Attends Engineer, structural: Not on file    Marital Status: Married  Catering manager Violence: Not At Risk (03/21/2022)   Humiliation, Afraid, Rape, and Kick questionnaire    Fear of Current or Ex-Partner: No    Emotionally Abused: No    Physically Abused: No    Sexually Abused: No     Constitutional: Denies fever, malaise, fatigue, headache or abrupt weight changes.  HEENT: Denies eye pain, eye redness, ear pain, ringing in the ears, wax buildup, runny nose, nasal congestion, bloody nose, or sore throat. Respiratory: Denies difficulty breathing, shortness of breath, cough or sputum production.   Cardiovascular: Denies chest pain, chest tightness, palpitations or swelling in the hands or feet.  Gastrointestinal: Denies abdominal pain, bloating, constipation, diarrhea or blood in the stool.  GU: Denies urgency, frequency, pain with urination, burning sensation, blood in urine, odor or discharge. Musculoskeletal: Denies decrease in range of motion, difficulty with gait, muscle pain or joint pain and swelling.  Skin: Denies redness, rashes, lesions or ulcercations.  Neurological: Patient reports inattention.  Denies dizziness, difficulty with memory, difficulty with speech or problems with balance and coordination.  Psych: Patient has a history of anxiety and depression.  Denies SI/HI.  No other specific complaints in a complete review of systems (except as listed in HPI above).  Objective:   Physical Exam   BP 112/64 (BP Location: Left Arm, Patient Position: Sitting, Cuff Size: Normal)   Pulse 95   Temp (!) 96.6 F (35.9 C) (Temporal)   Wt 140 lb (63.5 kg)   SpO2 97%    BMI 24.80 kg/m   Wt Readings from Last 3 Encounters:  07/28/22 148 lb 8 oz (67.4 kg)  07/11/22 146 lb 9.6 oz (66.5 kg)  06/02/22 141 lb (64 kg)    General: Appears her stated age, well developed, well nourished in NAD. Skin: Warm, dry and intact.  HEENT: Head: normal shape and size; Eyes: sclera white, no icterus, conjunctiva pink, PERRLA and EOMs intact;  Neck:  Neck supple, trachea midline. No masses, lumps or thyromegaly present.  Cardiovascular: Normal rate and rhythm. S1,S2 noted.  No murmur, rubs or gallops noted. No JVD or BLE edema.  Pulmonary/Chest: Normal effort and  positive vesicular breath sounds. No respiratory distress. No wheezes, rales or ronchi noted.  Abdomen: Normal bowel sounds.  Musculoskeletal: . No difficulty with gait.  Neurological: Alert and oriented. Coordination normal.  Psychiatric: Mood and affect normal. Behavior is normal. Judgment and thought content normal.     BMET    Component Value Date/Time   NA 139 06/02/2022 1326   NA 142 11/09/2013 0119   K 4.1 06/02/2022 1326   K 2.9 (L) 11/09/2013 0119   CL 104 06/02/2022 1326   CL 107 11/09/2013 0119   CO2 26 06/02/2022 1326   CO2 22 11/09/2013 0119   GLUCOSE 99 06/02/2022 1326   GLUCOSE 78 11/09/2013 0119   BUN 10 06/02/2022 1326   BUN 6 (L) 11/09/2013 0119   CREATININE 0.73 06/02/2022 1326   CALCIUM 9.2 06/02/2022 1326   CALCIUM 8.7 11/09/2013 0119   GFRNONAA >60 10/17/2014 0523   GFRNONAA >60 11/09/2013 0119   GFRAA >60 10/17/2014 0523   GFRAA >60 11/09/2013 0119    Lipid Panel     Component Value Date/Time   CHOL 186 06/02/2022 1326   TRIG 67 06/02/2022 1326   HDL 66 06/02/2022 1326   CHOLHDL 2.8 06/02/2022 1326   LDLCALC 105 (H) 06/02/2022 1326    CBC    Component Value Date/Time   WBC 5.3 06/02/2022 1326   RBC 4.42 06/02/2022 1326   HGB 12.2 06/02/2022 1326   HGB 10.4 (L) 11/09/2013 1259   HCT 37.8 06/02/2022 1326   HCT 44.4 11/09/2013 0119   PLT 382 06/02/2022 1326    PLT 390 11/09/2013 0119   MCV 85.5 06/02/2022 1326   MCV 90 11/09/2013 0119   MCH 27.6 06/02/2022 1326   MCHC 32.3 06/02/2022 1326   RDW 12.7 06/02/2022 1326   RDW 13.6 11/09/2013 0119   LYMPHSABS 2.9 10/16/2014 1050   LYMPHSABS 2.2 04/06/2013 1943   MONOABS 0.5 10/16/2014 1050   MONOABS 0.4 04/06/2013 1943   EOSABS 0.5 10/16/2014 1050   EOSABS 0.4 04/06/2013 1943   BASOSABS 0.1 10/16/2014 1050   BASOSABS 0.0 04/06/2013 1943    Hgb A1C No results found for: "HGBA1C"         Assessment & Plan:     RTC in 6 months, for your annual exam Nicki Reaper, NP

## 2022-12-13 NOTE — Assessment & Plan Note (Signed)
Will increase escitalopram to 30 mg daily Support offered

## 2022-12-14 ENCOUNTER — Other Ambulatory Visit: Payer: Self-pay | Admitting: Internal Medicine

## 2022-12-14 DIAGNOSIS — F1190 Opioid use, unspecified, uncomplicated: Secondary | ICD-10-CM

## 2022-12-15 ENCOUNTER — Other Ambulatory Visit: Payer: Self-pay

## 2022-12-15 DIAGNOSIS — F1190 Opioid use, unspecified, uncomplicated: Secondary | ICD-10-CM

## 2022-12-17 ENCOUNTER — Encounter: Payer: Self-pay | Admitting: Internal Medicine

## 2022-12-20 MED ORDER — ESCITALOPRAM OXALATE 20 MG PO TABS: 20.0000 mg | ORAL_TABLET | Freq: Every day | ORAL | 0 refills | Status: DC

## 2022-12-29 ENCOUNTER — Encounter: Payer: Self-pay | Admitting: Student

## 2022-12-29 ENCOUNTER — Ambulatory Visit (INDEPENDENT_AMBULATORY_CARE_PROVIDER_SITE_OTHER): Payer: 59 | Admitting: Student

## 2022-12-29 VITALS — BP 108/66 | HR 90 | Ht 63.0 in | Wt 146.8 lb

## 2022-12-29 DIAGNOSIS — F1191 Opioid use, unspecified, in remission: Secondary | ICD-10-CM | POA: Diagnosis not present

## 2022-12-29 DIAGNOSIS — Z23 Encounter for immunization: Secondary | ICD-10-CM | POA: Insufficient documentation

## 2022-12-29 DIAGNOSIS — F1190 Opioid use, unspecified, uncomplicated: Secondary | ICD-10-CM

## 2022-12-29 MED ORDER — BUPRENORPHINE HCL-NALOXONE HCL 8-2 MG SL SUBL
SUBLINGUAL_TABLET | SUBLINGUAL | 1 refills | Status: DC
Start: 2023-01-14 — End: 2023-03-07

## 2022-12-29 NOTE — Assessment & Plan Note (Signed)
Administered flu vaccine today.

## 2022-12-29 NOTE — Assessment & Plan Note (Addendum)
Patient has a past medical history of OUD. Patient has been in remission for about 5 years now. She denies any cravings. She denies any relapses.  Reviewed PDMP.  Patient has no early refills.  Patient is well-maintained on her Suboxone therapy. Urine tox screen has been with all expected results back in March 2024.  Plan: -Continue Suboxone 8-2 mg sublingual tablet 1 in the morning, half in afternoon, 1 at night -Follow-up in 2 months -Tox assure today

## 2022-12-29 NOTE — Progress Notes (Signed)
   CC: OUD Follow up  HPI:  [name] is a 32 y.o. female with past medical history of OUD presents the the clinic for OUD follow up visit. Please see assessment and plan for full HPI.   Past Medical History:  Diagnosis Date   Alcohol abuse    Anxiety    Depression    Heroin abuse (HCC)    Seizures (HCC)    Stroke (HCC)      Current Outpatient Medications:    amphetamine-dextroamphetamine (ADDERALL XR) 15 MG 24 hr capsule, Take 1 capsule by mouth 2 (two) times daily., Disp: 60 capsule, Rfl: 0   [START ON 01/14/2023] buprenorphine-naloxone (SUBOXONE) 8-2 mg SUBL SL tablet, PLACE ONE TABLET UNDER THE TONGUE ONE TIME DAILY IN THE MORNING , ONE-HALF TABLET UNDER THE TONGUE IN THE AFTERNOON, AND PLACE ONE TABLET UNDER THE TONGUE IN THE EVENING (10/26/22), Disp: 70 tablet, Rfl: 1   escitalopram (LEXAPRO) 10 MG tablet, Take 1 tablet (10 mg total) by mouth daily., Disp: 90 tablet, Rfl: 1   escitalopram (LEXAPRO) 20 MG tablet, Take 1 tablet (20 mg total) by mouth daily., Disp: 90 tablet, Rfl: 0   famotidine (PEPCID) 10 MG tablet, Take 1 tablet (10 mg total) by mouth daily., Disp: 90 tablet, Rfl: 1  Review of Systems:   Negative except what is stated in HPI.  Physical Exam:  Vitals:   12/29/22 1058  BP: 108/66  Pulse: 90  SpO2: 100%  Weight: 146 lb 12.8 oz (66.6 kg)  Height: 5\' 3"  (1.6 m)    General: Patient is sitting comfortably in the room  Head: Normocephalic, atraumatic  Cardio: Regular rate and rhythm, no murmurs, rubs or gallops Pulmonary: Clear to ausculation bilaterally with no rales, rhonchi, and crackles    Assessment & Plan:   Opioid use disorder in remission Patient has a past medical history of OUD. Patient has been in remission for about 5 years now. She denies any cravings. She denies any relapses.  Reviewed PDMP.  Patient has no early refills.  Patient is well-maintained on her Suboxone therapy. Urine tox screen has been with all expected results back in  March 2024.  Plan: -Continue Suboxone 8-2 mg sublingual tablet 1 in the morning, half in afternoon, 1 at night -Follow-up in 2 months -Tox assure today   Flu vaccine need Administered flu vaccine today   Patient discussed with Dr. Letta Kocher, DO PGY-2 Internal Medicine Resident  Pager: [phone]

## 2022-12-29 NOTE — Patient Instructions (Addendum)
Peggy Lewis you for allowing me to take part in your care today.  Here are your instructions.  1. Regarding your Suboxone we ae going to refill your Suboxone. You should have enough to last until 09/28 and at that time you should have it ready to be picked up in the pharmacy. I have sent you a refill for after that as well so you can push your next visit out to 2 month from now.   2. We updated your flu shot today.  3. We can see you back in 2 months   Thank you, Dr. Allena Katz  If you have any other questions please contact the internal medicine clinic at [phone] If it is after hours, please call the [facility] at [phone] and then ask the person who picks up for the resident on call.

## 2022-12-30 ENCOUNTER — Other Ambulatory Visit: Payer: Self-pay | Admitting: Internal Medicine

## 2022-12-30 MED ORDER — AMPHETAMINE-DEXTROAMPHET ER 15 MG PO CP24: 15.0000 mg | ORAL_CAPSULE | Freq: Two times a day (BID) | ORAL | 0 refills | Status: DC

## 2022-12-30 NOTE — Progress Notes (Signed)
Internal Medicine Clinic Attending  Case discussed with the resident physician at the time of the visit.  We reviewed the patient's history, exam, and pertinent patient test results.  I agree with the assessment, diagnosis, and plan of care documented in the resident's note.

## 2023-01-30 ENCOUNTER — Other Ambulatory Visit: Payer: Self-pay | Admitting: Internal Medicine

## 2023-01-31 ENCOUNTER — Encounter: Payer: Self-pay | Admitting: Internal Medicine

## 2023-01-31 MED ORDER — AMPHETAMINE-DEXTROAMPHET ER 15 MG PO CP24: 15.0000 mg | ORAL_CAPSULE | Freq: Two times a day (BID) | ORAL | 0 refills | Status: DC

## 2023-02-09 MED ORDER — AMPHETAMINE-DEXTROAMPHET ER 15 MG PO CP24: 15.0000 mg | ORAL_CAPSULE | Freq: Two times a day (BID) | ORAL | 0 refills | Status: DC

## 2023-02-09 NOTE — Addendum Note (Signed)
Addended by: Lorre Munroe on: 02/09/2023 01:47 PM   Modules accepted: Orders

## 2023-03-07 ENCOUNTER — Ambulatory Visit (INDEPENDENT_AMBULATORY_CARE_PROVIDER_SITE_OTHER): Payer: 59 | Admitting: Student

## 2023-03-07 ENCOUNTER — Encounter: Payer: Self-pay | Admitting: Student

## 2023-03-07 ENCOUNTER — Encounter: Payer: Self-pay | Admitting: Internal Medicine

## 2023-03-07 VITALS — BP 111/59 | HR 86 | Temp 97.9°F | Ht 63.0 in | Wt 153.7 lb

## 2023-03-07 DIAGNOSIS — F988 Other specified behavioral and emotional disorders with onset usually occurring in childhood and adolescence: Secondary | ICD-10-CM

## 2023-03-07 DIAGNOSIS — F909 Attention-deficit hyperactivity disorder, unspecified type: Secondary | ICD-10-CM | POA: Diagnosis not present

## 2023-03-07 DIAGNOSIS — K219 Gastro-esophageal reflux disease without esophagitis: Secondary | ICD-10-CM

## 2023-03-07 DIAGNOSIS — F1190 Opioid use, unspecified, uncomplicated: Secondary | ICD-10-CM

## 2023-03-07 MED ORDER — BUPRENORPHINE HCL-NALOXONE HCL 8-2 MG SL SUBL: SUBLINGUAL_TABLET | SUBLINGUAL | 1 refills | Status: DC

## 2023-03-07 NOTE — Progress Notes (Signed)
CC: OUD follow-up  HPI:  [name] is a 32 y.o. female living with a history stated below and presents today for OUD follow-up. Please see problem based assessment and plan for additional details.  Past Medical History:  Diagnosis Date   Alcohol abuse    Anxiety    Depression    Heroin abuse (HCC)    Seizures (HCC)    Stroke [facility])     Current Outpatient Medications on File Prior to Visit  Medication Sig Dispense Refill   amphetamine-dextroamphetamine (ADDERALL XR) 15 MG 24 hr capsule Take 1 capsule by mouth 2 (two) times daily. 40 capsule 0   buprenorphine-naloxone (SUBOXONE) 8-2 mg SUBL SL tablet PLACE ONE TABLET UNDER THE TONGUE ONE TIME DAILY IN THE MORNING , ONE-HALF TABLET UNDER THE TONGUE IN THE AFTERNOON, AND PLACE ONE TABLET UNDER THE TONGUE IN THE EVENING (10/26/22) 70 tablet 1   escitalopram (LEXAPRO) 10 MG tablet Take 1 tablet (10 mg total) by mouth daily. 90 tablet 1   escitalopram (LEXAPRO) 20 MG tablet Take 1 tablet (20 mg total) by mouth daily. 90 tablet 0   famotidine (PEPCID) 10 MG tablet Take 1 tablet (10 mg total) by mouth daily. 90 tablet 1   No current facility-administered medications on file prior to visit.    Family History  Problem Relation Age of Onset   Diabetes Mother    Depression Father    Drug abuse Father    Heart failure Maternal Grandmother    Colon cancer Neg Hx    Liver disease Neg Hx     Social History   Socioeconomic History   Marital status: Married    Spouse name: Not on file   Number of children: 0   Years of education: Not on file   Highest education level: GED or equivalent  Occupational History   Not on file  Tobacco Use   Smoking status: Former    Current packs/day: 0.00    Types: Cigarettes    Quit date: 2021    Years since quitting: 3.8   Smokeless tobacco: Never  Vaping Use   Vaping status: Every Day   Substances: Nicotine, Flavoring  Substance and Sexual Activity   Alcohol use: Not Currently     Comment: no alcohol x 57yrs   Drug use: Not Currently    Comment: history of heroin usage, no drugs x 3 yrs   Sexual activity: Yes    Partners: Female  Other Topics Concern   Not on file  Social History Narrative   Lives with her mom, unemployed, no children, she has a girlfriend   Social Determinants of Corporate investment banker Strain: Medium Risk (12/09/2022)   Overall Financial Resource Strain (CARDIA)    Difficulty of Paying Living Expenses: Somewhat hard  Food Insecurity: No Food Insecurity (12/09/2022)   Hunger Vital Sign    Worried About Running Out of Food in the Last Year: Never true    Ran Out of Food in the Last Year: Never true  Transportation Needs: No Transportation Needs (12/09/2022)   PRAPARE - Administrator, Civil Service (Medical): No    Lack of Transportation (Non-Medical): No  Physical Activity: Sufficiently Active (12/09/2022)   Exercise Vital Sign    Days of Exercise per Week: 6 days    Minutes of Exercise per Session: 150+ min  Stress: Stress Concern Present (12/09/2022)   [employer] of Occupational Health - Occupational Stress Questionnaire    Feeling  of Stress : Rather much  Social Connections: Unknown (12/09/2022)   Social Connection and Isolation Panel [NHANES]    Frequency of Communication with Friends and Family: More than three times a week    Frequency of Social Gatherings with Friends and Family: More than three times a week    Attends Religious Services: Patient declined    Database administrator or Organizations: No    Attends Engineer, structural: Not on file    Marital Status: Married  Catering manager Violence: Not At Risk (03/21/2022)   Humiliation, Afraid, Rape, and Kick questionnaire    Fear of Current or Ex-Partner: No    Emotionally Abused: No    Physically Abused: No    Sexually Abused: No    Review of Systems: ROS negative except for what is noted on the assessment and plan.  Vitals:   03/07/23 0929   BP: (!) 111/59  Pulse: 86  Temp: 97.9 F (36.6 C)  TempSrc: Oral  SpO2: 98%  Weight: 153 lb 11.2 oz (69.7 kg)  Height: 5\' 3"  (1.6 m)   Physical Exam: Constitutional: well-appearing woman ,sitting in chair, in no acute distress Cardiovascular: regular rate and rhythm, no m/r/g Pulmonary/Chest: normal work of breathing on room air, lungs clear to auscultation bilaterally Abdominal: soft, non-tender, non-distended MSK: normal bulk and tone Neurological: alert & oriented x 3, no focal deficit Psych: normal mood and behavior  Assessment & Plan:   No problem-specific Assessment & Plan notes found for this encounter.  Opioid use disorder  History of opioid use disorder currently being managed with Suboxone, presents for a Suboxone refill otherwise does not have any other issues. Counseling on  potential dental decay associated with Suboxone adequately discussed with the patient - Refill Suboxone 8-2 mg  - Urine Toxassure  ADD on Adderall XR History of ADD currently on Adderall.  Follows up with [place] at [facility] medical center.  Patient patient is to switch her Adderall prescription to [facility] internal medicine clinic at this time.  Follow-up with administration to have that switch taking care of.  Patient expressed concerns of increasing her Adderall dose because her wife has noticed she is not office so later in the day because her Adderall wears off. -Need collateral from her previous Adderall prescriber at the [facility] -Continue Adderall 15 mg XR twice daily  GERD History of GERD on Pepcid.  Reports adequate control of her GERD on this regimen. -Continue Pepcid 10 mg daily  Patient seen with Dr. Cleda Daub  Kathleen Lime, M.D [facility] Phone: [phone] Date 03/07/2023 Time 9:57 AM

## 2023-03-07 NOTE — Patient Instructions (Addendum)
Thank you, Peggy Lewis for allowing [place] to provide your care today. Today we discussed your general health including your Suboxone refills and also your concern to switch all of your healthcare maintenance and prescriptions to [facility] internal medicine clinic. -I have refilled your Suboxone for the next month -We will start the process of switching you over to our care -Please do not hesitate to call us if you have any concerns I have ordered the following labs for you:  Lab Orders         ToxAssure Select,+Antidepr,UR       Tests ordered today:  Referrals ordered today:   Referral Orders  No referral(s) requested today     I have ordered the following medication/changed the following medications:   Stop the following medications: Medications Discontinued During This Encounter  Medication Reason   buprenorphine-naloxone (SUBOXONE) 8-2 mg SUBL SL tablet Reorder     Start the following medications: Meds ordered this encounter  Medications   buprenorphine-naloxone (SUBOXONE) 8-2 mg SUBL SL tablet    Sig: PLACE ONE TABLET UNDER THE TONGUE ONE TIME DAILY IN THE MORNING , ONE-HALF TABLET UNDER THE TONGUE IN THE AFTERNOON, AND PLACE ONE TABLET UNDER THE TONGUE IN THE EVENING (10/26/22)    Dispense:  70 tablet    Refill:  1     Follow up:  1 month      Should you have any questions or concerns please call the internal medicine clinic at [phone].    Kathleen Lime, M.D [facility]

## 2023-03-10 LAB — TOXASSURE SELECT,+ANTIDEPR,UR

## 2023-03-14 NOTE — Progress Notes (Signed)
Internal Medicine Clinic Attending  I was physically present during the key portions of the resident provided service and participated in the medical decision making of patient's management care. I reviewed pertinent patient test results.  The assessment, diagnosis, and plan were formulated together and I agree with the documentation in the resident's note.  Gust Rung, DO

## 2023-03-20 ENCOUNTER — Other Ambulatory Visit: Payer: Self-pay

## 2023-03-20 MED ORDER — AMPHETAMINE-DEXTROAMPHET ER 15 MG PO CP24: 15.0000 mg | ORAL_CAPSULE | Freq: Two times a day (BID) | ORAL | 0 refills | Status: DC

## 2023-03-21 ENCOUNTER — Other Ambulatory Visit: Payer: Self-pay | Admitting: Internal Medicine

## 2023-03-21 ENCOUNTER — Telehealth: Payer: Self-pay | Admitting: *Deleted

## 2023-03-21 MED ORDER — AMPHETAMINE-DEXTROAMPHET ER 15 MG PO CP24: 15.0000 mg | ORAL_CAPSULE | Freq: Two times a day (BID) | ORAL | 0 refills | Status: DC

## 2023-03-21 NOTE — Telephone Encounter (Signed)
Call from patient and then call to Pharmacy. Prescription for the Adderall XR written bt Dr. Mickie Bail will need to be resent by another physician that is able to prescribe this Class of medication.

## 2023-03-21 NOTE — Progress Notes (Signed)
Patient is prescribed Adderall 15 mg XR twice daily for ADD. She is switching her care over to the [facility] and we will now prescribe this. PDMP reviewed and sent in rx.

## 2023-03-21 NOTE — Telephone Encounter (Signed)
Call to patient to inform her that prescription had been done.  Message left that a prescription had been sent to her Pharmacy.  RTC from patient informed her that the prescription had been sent to her Pharmacy as requested.

## 2023-03-21 NOTE — Telephone Encounter (Signed)
RTC to patient stated hat everything has been switched to the Clinics.  She no longer see       that PA. Would like t see if the Adderal could be done for her as well.

## 2023-03-23 ENCOUNTER — Other Ambulatory Visit: Payer: Self-pay

## 2023-03-23 MED ORDER — AMPHETAMINE-DEXTROAMPHET ER 15 MG PO CP24: 15.0000 mg | ORAL_CAPSULE | Freq: Two times a day (BID) | ORAL | 0 refills | Status: DC

## 2023-03-28 ENCOUNTER — Ambulatory Visit (INDEPENDENT_AMBULATORY_CARE_PROVIDER_SITE_OTHER): Payer: 59 | Admitting: Student

## 2023-03-28 ENCOUNTER — Encounter: Payer: Self-pay | Admitting: Student

## 2023-03-28 VITALS — BP 117/76 | HR 90 | Temp 98.3°F | Wt 150.6 lb

## 2023-03-28 DIAGNOSIS — F1011 Alcohol abuse, in remission: Secondary | ICD-10-CM

## 2023-03-28 DIAGNOSIS — F1190 Opioid use, unspecified, uncomplicated: Secondary | ICD-10-CM

## 2023-03-28 DIAGNOSIS — F909 Attention-deficit hyperactivity disorder, unspecified type: Secondary | ICD-10-CM

## 2023-03-28 DIAGNOSIS — D229 Melanocytic nevi, unspecified: Secondary | ICD-10-CM | POA: Diagnosis not present

## 2023-03-28 DIAGNOSIS — F1091 Alcohol use, unspecified, in remission: Secondary | ICD-10-CM

## 2023-03-28 DIAGNOSIS — F1191 Opioid use, unspecified, in remission: Secondary | ICD-10-CM

## 2023-03-28 MED ORDER — ESCITALOPRAM OXALATE 10 MG PO TABS: 10.0000 mg | ORAL_TABLET | Freq: Every day | ORAL | 1 refills | Status: DC

## 2023-03-28 MED ORDER — ESCITALOPRAM OXALATE 20 MG PO TABS: 20.0000 mg | ORAL_TABLET | Freq: Every day | ORAL | 0 refills | Status: DC

## 2023-03-28 MED ORDER — AMPHETAMINE-DEXTROAMPHET ER 15 MG PO CP24: 15.0000 mg | ORAL_CAPSULE | Freq: Two times a day (BID) | ORAL | 0 refills | Status: DC

## 2023-03-28 NOTE — Assessment & Plan Note (Signed)
Last tox assure 3 weeks ago unremarkable.  Will not be getting a urine sample today. -Continue on Suboxone 8-2 mg

## 2023-03-28 NOTE — Assessment & Plan Note (Addendum)
Patient expressed concerns of a benign mole on the right side of her face.  York Spaniel it has been there for many years and has not changed in size but bleeds occasionally.  Please see physical exam for image -Ambulatory referral to dermatology

## 2023-03-28 NOTE — Assessment & Plan Note (Addendum)
Patient has a history of alcohol use disorder but in remission now.  Last alcohol use was around about one year ago. - Alcohol abuse risk and complications adequately discussed with the patient

## 2023-03-28 NOTE — Progress Notes (Signed)
CC: Opioid use disorder follow-up  HPI:  [name] is a 32 y.o. female living with a history stated below and presents today for opioid use disorder follow-up and also to discuss other chronic conditions. Please see problem based assessment and plan for additional details.  Past Medical History:  Diagnosis Date   Alcohol abuse    Anxiety    Depression    Heroin abuse (HCC)    Seizures (HCC)    Stroke (HCC)     Current Outpatient Medications on File Prior to Visit  Medication Sig Dispense Refill   buprenorphine-naloxone (SUBOXONE) 8-2 mg SUBL SL tablet PLACE ONE TABLET UNDER THE TONGUE ONE TIME DAILY IN THE MORNING , ONE-HALF TABLET UNDER THE TONGUE IN THE AFTERNOON, AND PLACE ONE TABLET UNDER THE TONGUE IN THE EVENING (10/26/22) 70 tablet 1   famotidine (PEPCID) 10 MG tablet Take 1 tablet (10 mg total) by mouth daily. 90 tablet 1   No current facility-administered medications on file prior to visit.    Family History  Problem Relation Age of Onset   Diabetes Mother    Depression Father    Drug abuse Father    Heart failure Maternal Grandmother    Colon cancer Neg Hx    Liver disease Neg Hx     Social History   Socioeconomic History   Marital status: Married    Spouse name: Not on file   Number of children: 0   Years of education: Not on file   Highest education level: GED or equivalent  Occupational History   Not on file  Tobacco Use   Smoking status: Former    Current packs/day: 0.00    Types: Cigarettes    Quit date: 2021    Years since quitting: 3.9   Smokeless tobacco: Never  Vaping Use   Vaping status: Every Day   Substances: Nicotine, Flavoring  Substance and Sexual Activity   Alcohol use: Not Currently    Comment: no alcohol x 77yrs   Drug use: Not Currently    Comment: history of heroin usage, no drugs x 3 yrs   Sexual activity: Yes    Partners: Female  Other Topics Concern   Not on file  Social History Narrative   Lives with her mom,  unemployed, no children, she has a girlfriend   Social Determinants of Corporate investment banker Strain: Medium Risk (12/09/2022)   Overall Financial Resource Strain (CARDIA)    Difficulty of Paying Living Expenses: Somewhat hard  Food Insecurity: No Food Insecurity (12/09/2022)   Hunger Vital Sign    Worried About Running Out of Food in the Last Year: Never true    Ran Out of Food in the Last Year: Never true  Transportation Needs: No Transportation Needs (12/09/2022)   PRAPARE - Administrator, Civil Service (Medical): No    Lack of Transportation (Non-Medical): No  Physical Activity: Sufficiently Active (12/09/2022)   Exercise Vital Sign    Days of Exercise per Week: 6 days    Minutes of Exercise per Session: 150+ min  Stress: Stress Concern Present (12/09/2022)   [employer] of Occupational Health - Occupational Stress Questionnaire    Feeling of Stress : Rather much  Social Connections: Unknown (12/09/2022)   Social Connection and Isolation Panel [NHANES]    Frequency of Communication with Friends and Family: More than three times a week    Frequency of Social Gatherings with Friends and Family: More than three times  a week    Attends Religious Services: Patient declined    Active Member of Clubs or Organizations: No    Attends Banker Meetings: Not on file    Marital Status: Married  Catering manager Violence: Not At Risk (03/21/2022)   Humiliation, Afraid, Rape, and Kick questionnaire    Fear of Current or Ex-Partner: No    Emotionally Abused: No    Physically Abused: No    Sexually Abused: No    Review of Systems: ROS negative except for what is noted on the assessment and plan.  Vitals:   03/28/23 1014  BP: 117/76  Pulse: 90  Temp: 98.3 F (36.8 C)  TempSrc: Oral  SpO2: 100%  Weight: 150 lb 9.6 oz (68.3 kg)    Physical Exam: Constitutional: well-appearing woman, sitting in chair, in no acute distress Cardiovascular: regular rate  and rhythm, no m/r/g Pulmonary/Chest: normal work of breathing on room air, lungs clear to auscultation bilaterally Abdominal: soft, non-tender, non-distended MSK: normal bulk and tone.  Benign mole on the right side of face please see image  Neurological: alert & oriented x 3, no focal deficit Skin: warm and dry Psych: normal mood and behavior  Assessment & Plan:   Opioid use disorder in remission Last tox assure 3 weeks ago unremarkable.  Will not be getting a urine sample today. -Continue on Suboxone 8-2 mg  ADD (attention deficit disorder) History of attention deficit disorder.  Currently being managed with Adderall 15 mg extended release. -Continue on same regimen  Benign mole Patient expressed concerns of a benign mole on the right side of her face.  [place] Spaniel it has been there for many years and has not changed in size but bleeds occasionally.  Please see physical exam for image -Ambulatory referral to dermatology  History of alcohol abuse Patient has a history of alcohol use disorder but in remission now.  Last alcohol use was around about one year ago. - Alcohol abuse risk and complications adequately discussed with the patient  Patient discussed with Dr. Ginger Carne, M.D [facility] Phone: [phone] Date 03/28/2023 Time 11:00 AM

## 2023-03-28 NOTE — Patient Instructions (Signed)
Thank you, [name] for allowing [place] to provide your care today. Today we discussed discussed your general health and your medications. -I have refilled your Suboxone. -I have refilled your depression medications as well -I sent you another prescription for your Adderall.  We would like to stick to the same long-acting for now until days and indication to switch.  If you continue to have issues getting the prescriptions a week before you run out of your medication please give [place] a call back.  I have ordered the following labs for you:  Lab Orders  No laboratory test(s) ordered today     Tests ordered today:    Referrals ordered today:   Referral Orders  No referral(s) requested today     I have ordered the following medication/changed the following medications:   Stop the following medications: Medications Discontinued During This Encounter  Medication Reason   escitalopram (LEXAPRO) 10 MG tablet Reorder   escitalopram (LEXAPRO) 20 MG tablet Reorder   amphetamine-dextroamphetamine (ADDERALL XR) 15 MG 24 hr capsule Reorder     Start the following medications: Meds ordered this encounter  Medications   amphetamine-dextroamphetamine (ADDERALL XR) 15 MG 24 hr capsule    Sig: Take 1 capsule by mouth 2 (two) times daily.    Dispense:  60 capsule    Refill:  0    Rx 1 of  2   escitalopram (LEXAPRO) 10 MG tablet    Sig: Take 1 tablet (10 mg total) by mouth daily.    Dispense:  90 tablet    Refill:  1    Take with 20 mg for a total of 30 mg   escitalopram (LEXAPRO) 20 MG tablet    Sig: Take 1 tablet (20 mg total) by mouth daily.    Dispense:  90 tablet    Refill:  0     Follow up: 3 months   Remember:   Should you have any questions or concerns please call the internal medicine clinic at [phone].    Kathleen Lime, M.D [facility]

## 2023-03-28 NOTE — Assessment & Plan Note (Signed)
History of attention deficit disorder.  Currently being managed with Adderall 15 mg extended release. -Continue on same regimen

## 2023-04-03 NOTE — Progress Notes (Signed)
Internal Medicine Clinic Attending  Case discussed with the resident at the time of the visit.  We reviewed the resident's history and exam and pertinent patient test results.  I agree with the assessment, diagnosis, and plan of care documented in the resident's note.  

## 2023-05-04 ENCOUNTER — Other Ambulatory Visit: Payer: Self-pay | Admitting: Student

## 2023-05-06 ENCOUNTER — Other Ambulatory Visit: Payer: Self-pay | Admitting: Student

## 2023-05-06 DIAGNOSIS — F1190 Opioid use, unspecified, uncomplicated: Secondary | ICD-10-CM

## 2023-05-06 NOTE — Telephone Encounter (Signed)
Received after hours page.  Returned call to patient and confirmed DOB.  Patient request for refill for Suboxone.  States last refill was 04/08/2023 for 30-day supply.  She was able to take it today but will not have any available for tomorrow.  Reviewed PDMP and appropriate.  Last Tox assure was appropriate.  Will refill Suboxone to [facility] pharmacy.  Patient verbalizes understanding and all questions addressed. -Suboxone 8-2 mg SL 1 tablet in morning, half tablet in afternoon, 1 tablet in evening (# 70 tablets)

## 2023-05-25 ENCOUNTER — Other Ambulatory Visit: Payer: Self-pay | Admitting: Student

## 2023-05-29 ENCOUNTER — Ambulatory Visit: Payer: Managed Care, Other (non HMO) | Admitting: Student

## 2023-05-29 VITALS — BP 106/61 | HR 86 | Temp 98.8°F | Ht 63.0 in | Wt 154.3 lb

## 2023-05-29 DIAGNOSIS — F1191 Opioid use, unspecified, in remission: Secondary | ICD-10-CM

## 2023-05-29 DIAGNOSIS — K219 Gastro-esophageal reflux disease without esophagitis: Secondary | ICD-10-CM

## 2023-05-29 DIAGNOSIS — F909 Attention-deficit hyperactivity disorder, unspecified type: Secondary | ICD-10-CM | POA: Diagnosis not present

## 2023-05-29 DIAGNOSIS — F1121 Opioid dependence, in remission: Secondary | ICD-10-CM

## 2023-05-29 DIAGNOSIS — F1190 Opioid use, unspecified, uncomplicated: Secondary | ICD-10-CM

## 2023-05-29 MED ORDER — BUPRENORPHINE HCL-NALOXONE HCL 8-2 MG SL SUBL
SUBLINGUAL_TABLET | SUBLINGUAL | 3 refills | Status: DC
Start: 2023-05-29 — End: 2023-06-29

## 2023-05-29 MED ORDER — AMPHETAMINE-DEXTROAMPHET ER 15 MG PO CP24: 15.0000 mg | ORAL_CAPSULE | Freq: Two times a day (BID) | ORAL | 0 refills | Status: DC

## 2023-05-29 NOTE — Patient Instructions (Addendum)
 Thank you, [name] for allowing us  to provide your care today. Today we discussed your general health and refilled your prescription. I will see you back in 3 months   I have ordered the following labs for you:  Lab Orders  No laboratory test(s) ordered today     Tests ordered today:    Referrals ordered today:   Referral Orders  No referral(s) requested today     I have ordered the following medication/changed the following medications:   Stop the following medications: Medications Discontinued During This Encounter  Medication Reason   amphetamine -dextroamphetamine (ADDERALL XR) 15 MG 24 hr capsule Reorder   buprenorphine -naloxone  (SUBOXONE ) 8-2 mg SUBL SL tablet Reorder     Start the following medications: Meds ordered this encounter  Medications   buprenorphine -naloxone  (SUBOXONE ) 8-2 mg SUBL SL tablet    Sig: PLACE ONE TABLET UNDER THE TONGUE EVERY MORNING, PLACE ONE-HALF TABLET UNDER THE TONGUE EVERY AFTERNOON, AND PLACE ONE TABLET UNDER THE TONGUE EVERY EVENING70    Dispense:  70 tablet    Refill:  3   amphetamine -dextroamphetamine (ADDERALL XR) 15 MG 24 hr capsule    Sig: Take 1 capsule by mouth 2 (two) times daily.    Dispense:  60 capsule    Refill:  0    Refill ONLY after 06/14/2022     Follow up:    Remember:   Should you have any questions or concerns please call the internal medicine clinic at [phone].    Fay Hoop, M.D [facility]

## 2023-05-29 NOTE — Assessment & Plan Note (Addendum)
 Currently stable on Adderall 15 mg XL twice daily. -Prescribe Adderall 15 mg twice daily for the next 30 days

## 2023-05-29 NOTE — Progress Notes (Signed)
 CC: Routine follow up   HPI:  [name] is a 33 y.o. female living with a history stated below and presents today for routine follow-up visit. Please see problem based assessment and plan for additional details.  Past Medical History:  Diagnosis Date   Alcohol abuse    Anxiety    Depression    Heroin abuse (HCC)    Seizures (HCC)    Stroke [facility])     Current Outpatient Medications on File Prior to Visit  Medication Sig Dispense Refill   escitalopram  (LEXAPRO ) 10 MG tablet Take 1 tablet (10 mg total) by mouth daily. 90 tablet 1   escitalopram  (LEXAPRO ) 20 MG tablet TAKE 1 TABLET BY MOUTH EVERY DAY 90 tablet 1   famotidine  (PEPCID ) 10 MG tablet Take 1 tablet (10 mg total) by mouth daily. 90 tablet 1   No current facility-administered medications on file prior to visit.    Family History  Problem Relation Age of Onset   Diabetes Mother    Depression Father    Drug abuse Father    Heart failure Maternal Grandmother    Colon cancer Neg Hx    Liver disease Neg Hx     Social History   Socioeconomic History   Marital status: Married    Spouse name: Not on file   Number of children: 0   Years of education: Not on file   Highest education level: GED or equivalent  Occupational History   Not on file  Tobacco Use   Smoking status: Former    Current packs/day: 0.00    Types: Cigarettes    Quit date: 2021    Years since quitting: 4.1   Smokeless tobacco: Never  Vaping Use   Vaping status: Every Day   Substances: Nicotine , Flavoring  Substance and Sexual Activity   Alcohol use: Not Currently    Comment: no alcohol x 39yrs   Drug use: Not Currently    Comment: history of heroin usage, no drugs x 3 yrs   Sexual activity: Yes    Partners: Female  Other Topics Concern   Not on file  Social History Narrative   Lives with her mom, unemployed, no children, she has a girlfriend   Social Drivers of Corporate investment banker Strain: Medium Risk (12/09/2022)    Overall Financial Resource Strain (CARDIA)    Difficulty of Paying Living Expenses: Somewhat hard  Food Insecurity: No Food Insecurity (12/09/2022)   Hunger Vital Sign    Worried About Running Out of Food in the Last Year: Never true    Ran Out of Food in the Last Year: Never true  Transportation Needs: No Transportation Needs (12/09/2022)   PRAPARE - Administrator, Civil Service (Medical): No    Lack of Transportation (Non-Medical): No  Physical Activity: Sufficiently Active (12/09/2022)   Exercise Vital Sign    Days of Exercise per Week: 6 days    Minutes of Exercise per Session: 150+ min  Stress: Stress Concern Present (12/09/2022)   [employer] of Occupational Health - Occupational Stress Questionnaire    Feeling of Stress : Rather much  Social Connections: Unknown (12/09/2022)   Social Connection and Isolation Panel [NHANES]    Frequency of Communication with Friends and Family: More than three times a week    Frequency of Social Gatherings with Friends and Family: More than three times a week    Attends Religious Services: Patient declined    Active Member of  Clubs or Organizations: No    Attends Banker Meetings: Not on file    Marital Status: Married  Intimate Partner Violence: Not At Risk (03/21/2022)   Humiliation, Afraid, Rape, and Kick questionnaire    Fear of Current or Ex-Partner: No    Emotionally Abused: No    Physically Abused: No    Sexually Abused: No    Review of Systems: ROS negative except for what is noted on the assessment and plan.  Vitals:   05/29/23 1007  BP: 106/61  Pulse: 86  Temp: 98.8 F (37.1 C)  TempSrc: Oral  SpO2: 100%  Weight: 154 lb 4.8 oz (70 kg)  Height: 5\' 3"  (1.6 m)    Physical Exam: Constitutional: well-appearing woman sitting in chair, in no acute distress Cardiovascular: regular rate and rhythm, no m/r/g Pulmonary/Chest: normal work of breathing on room air, lungs clear to auscultation  bilaterally MSK: normal bulk and tone Neurological: alert & oriented x 3, no focal deficit Skin: warm and dry Psych: normal mood and behavior  Assessment & Plan:   ADD (attention deficit disorder) Currently stable on Adderall 15 mg XL twice daily. -Prescribe Adderall 15 mg twice daily for the next 30 days  Opioid use disorder, moderate, in sustained remission, on maintenance therapy (HCC) Patient has a history of opioid use disorder currently in remission and being managed with Suboxone . Patient has remained compliant and tox assure has been reassuring for the past year. Will prescribe 3 months worth of Suboxone  and follow-up with the patient in 3 months. -Tox assure today -Refill Suboxone  -Follow-up in 3 months  GERD (gastroesophageal reflux disease) Stable on Pepcid  as needed.  No concerns at this time   Patient discussed with Dr. Salbador Crate, M.D [facility] Phone: [phone] Date 05/29/2023 Time 11:56 AM

## 2023-05-29 NOTE — Assessment & Plan Note (Addendum)
 Patient has a history of opioid use disorder currently in remission and being managed with Suboxone . Patient has remained compliant and tox assure has been reassuring for the past year. Will prescribe 3 months worth of Suboxone  and follow-up with the patient in 3 months. -Tox assure today -Refill Suboxone  -Follow-up in 3 months

## 2023-05-29 NOTE — Assessment & Plan Note (Signed)
 Stable on Pepcid  as needed.  No concerns at this time

## 2023-05-31 LAB — TOXASSURE SELECT,+ANTIDEPR,UR

## 2023-06-01 NOTE — Progress Notes (Signed)
Internal Medicine Clinic Attending  Case discussed with the resident at the time of the visit.  We reviewed the resident's history and exam and pertinent patient test results.  I agree with the assessment, diagnosis, and plan of care documented in the resident's note.

## 2023-06-05 ENCOUNTER — Other Ambulatory Visit: Payer: Self-pay | Admitting: Student

## 2023-06-05 ENCOUNTER — Telehealth: Payer: Self-pay | Admitting: *Deleted

## 2023-06-05 MED ORDER — AMPHETAMINE-DEXTROAMPHET ER 15 MG PO CP24: 15.0000 mg | ORAL_CAPSULE | Freq: Two times a day (BID) | ORAL | 0 refills | Status: DC

## 2023-06-05 NOTE — Telephone Encounter (Signed)
All from patient states that the dates on the last Adderall XR prescription needs to be redone as she will need to pick up by 06/11/2023.

## 2023-06-06 NOTE — Telephone Encounter (Signed)
Call to patient informed her that the prescription for the Adderall XR has been sent with a pick up date of 06/11/2023. Voiced understanding of.

## 2023-06-09 ENCOUNTER — Ambulatory Visit: Payer: 59 | Admitting: Nurse Practitioner

## 2023-06-09 ENCOUNTER — Encounter: Payer: Self-pay | Admitting: Internal Medicine

## 2023-06-27 ENCOUNTER — Other Ambulatory Visit: Payer: Self-pay | Admitting: Student

## 2023-06-27 DIAGNOSIS — F1190 Opioid use, unspecified, uncomplicated: Secondary | ICD-10-CM

## 2023-06-27 NOTE — Telephone Encounter (Signed)
 Copied from CRM [id number]. Topic: Clinical - Medication Refill >> Jun 27, 2023  3:15 PM Hamdi H wrote: Most Recent Primary Care Visit:  Provider: Kathleen Lime  Department: [facility]  Visit Type: OPEN ESTABLISHED  Date: 05/29/2023  Medication: buprenorphine-naloxone (SUBOXONE) 8-2 mg SUBL SL tablet  Has the patient contacted their pharmacy? Yes (Agent: If no, request that the patient contact the pharmacy for the refill. If patient does not wish to contact the pharmacy document the reason why and proceed with request.) (Agent: If yes, when and what did the pharmacy advise?) The pharmacy stated she can't get more until 3/13. She only has 4 left and needs a new refill sent to the pharmacy.   Is this the correct pharmacy for this prescription? Yes If no, delete pharmacy and type the correct one.  This is the patient's preferred pharmacy:  [facility] [address] - [place], [place] - [address] [facility] [address] [place] [address] Phone: [phone] Fax: [phone]   Has the prescription been filled recently? No  Is the patient out of the medication? No  Has the patient been seen for an appointment in the last year OR does the patient have an upcoming appointment? Yes  Can we respond through MyChart? Yes  Agent: Please be advised that Rx refills may take up to 3 business days. We ask that you follow-up with your pharmacy.

## 2023-06-29 ENCOUNTER — Other Ambulatory Visit: Payer: Self-pay | Admitting: Student

## 2023-06-29 ENCOUNTER — Ambulatory Visit: Payer: Self-pay | Admitting: Student

## 2023-06-29 DIAGNOSIS — F1190 Opioid use, unspecified, uncomplicated: Secondary | ICD-10-CM

## 2023-06-29 MED ORDER — BUPRENORPHINE HCL-NALOXONE HCL 8-2 MG SL SUBL
SUBLINGUAL_TABLET | SUBLINGUAL | 3 refills | Status: DC
Start: 2023-06-29 — End: 2023-06-29

## 2023-06-29 MED ORDER — BUPRENORPHINE HCL-NALOXONE HCL 8-2 MG SL SUBL: SUBLINGUAL_TABLET | SUBLINGUAL | 3 refills | Status: DC

## 2023-06-29 MED ORDER — BUPRENORPHINE HCL-NALOXONE HCL 8-2 MG SL SUBL: SUBLINGUAL_TABLET | SUBLINGUAL | 2 refills | Status: DC

## 2023-06-29 NOTE — Telephone Encounter (Signed)
 Copied from CRM [id number]. Topic: Clinical - Medication Question >> Jun 29, 2023 10:28 AM Irine Seal wrote: Reason for CRM: buprenorphine-naloxone (SUBOXONE) 8-2 mg SUBL SL  patient would not elaborate any further when I asked probing questions they just stated they wanted to speak with a nurse in regards to the medication as they have a few questions to ask in regards to it.  Patient callback [phone] >> Jun 29, 2023  3:21 PM Corin V wrote: Patient called and said that the doctor needed to resend the prescription with the note that it is "ABLE TO FILL EARLY ON 06/29/23". She said the pharmacy told her it only has 3/13 on there, but needs to have the able to fill early notation as well. She is completely out of the medication and needs to have it available when she gets off work today.

## 2023-06-29 NOTE — Telephone Encounter (Signed)
 Refill done per PCP.

## 2023-06-29 NOTE — Telephone Encounter (Signed)
 Copied from CRM [id number]. Topic: Clinical - Medication Question >> Jun 29, 2023  3:21 PM Antony Haste wrote: Reason for CRM: Tiona from [facility] needs verbal confirmation if the patient can receive an early refill of  buprenorphine-naloxone (SUBOXONE) 8-2 mg SUBL SL tablet since it has not been specified. She is requesting a callback before the end of the day. Callback #:[phone]

## 2023-06-29 NOTE — Telephone Encounter (Signed)
 Copied from CRM [id number]. Topic: Clinical - Medication Question >> Jun 29, 2023 10:28 AM Peggy Lewis wrote: Reason for CRM: buprenorphine-naloxone (SUBOXONE) 8-2 mg SUBL SL  patient would not elaborate any further when I asked probing questions they just stated they wanted to speak with a nurse in regards to the medication as they have a few questions to ask in regards to it.  Patient callback [phone]  Attempted to call patient- no answer- "call can not be completed as dialed" 2x , unable to leave call back message

## 2023-06-29 NOTE — Telephone Encounter (Signed)
 I talked to pt who stated she ran out Suboxone and the pharmacy will not refill until tomorrow (aware she has refills). Stated she needs enough pills to get her thru today and tonight.

## 2023-06-29 NOTE — Telephone Encounter (Signed)
 Copied from CRM [id number]. Topic: Clinical - Medication Question >> Jun 29, 2023 10:28 AM Irine Seal wrote: Reason for CRM: buprenorphine-naloxone (SUBOXONE) 8-2 mg SUBL SL  patient would not elaborate any further when I asked probing questions they just stated they wanted to speak with a nurse in regards to the medication as they have a few questions to ask in regards to it.  Patient callback [phone]   Patient calling to state that she is out of her Suboxone and cannot get the prescription filled until tomorrow. She states that she took half a dose today and has no more for today or tomorrow morning. She states that the pharmacy informed her that if another prescription was sent that says she can pick it up today (06/29/23) that they would be able to fill it for her. [facility] called but is currently closed for lunch.   Please call the patient back with an update to this request. Patient reports she will call back if she does not hear back from the office for an update.     Reason for Disposition  Caller requesting a CONTROLLED substance prescription refill (e.g., narcotics, ADHD medicines)  Answer Assessment - Initial Assessment Questions 1. DRUG NAME: "What medicine do you need to have refilled?"     Suboxone 8-2 mg SUBL SL 2. REFILLS REMAINING: "How many refills are remaining?" (Note: The label on the medicine or pill bottle will show how many refills are remaining. If there are no refills remaining, then a renewal may be needed.)     Yes 4. PRESCRIBING HCP: "Who prescribed it?" Reason: If prescribed by specialist, call should be referred to that group.     Dr. Mickie Bail or Dr. Cinda Quest 5. SYMPTOMS: "Do you have any symptoms?"     No  Protocols used: Medication Refill and Renewal Call-A-AH

## 2023-07-24 MED ORDER — BUPRENORPHINE HCL-NALOXONE HCL 8-2 MG SL SUBL: SUBLINGUAL_TABLET | SUBLINGUAL | 3 refills | Status: DC

## 2023-08-10 ENCOUNTER — Other Ambulatory Visit: Payer: Self-pay | Admitting: Student

## 2023-08-10 MED ORDER — AMPHETAMINE-DEXTROAMPHET ER 15 MG PO CP24: 15.0000 mg | ORAL_CAPSULE | Freq: Two times a day (BID) | ORAL | 0 refills | Status: DC

## 2023-08-10 NOTE — Telephone Encounter (Signed)
 Copied from CRM [id number]. Topic: Clinical - Medication Refill >> Aug 10, 2023  2:35 PM Hamdi H wrote: Most Recent Primary Care Visit:  Provider: Fay Hoop  Department: [facility]  Visit Type: OPEN ESTABLISHED  Date: 05/29/2023  Medication: amphetamine -dextroamphetamine (ADDERALL XR) 15 MG 24 hr capsule  Has the patient contacted their pharmacy? Yes (Agent: If no, request that the patient contact the pharmacy for the refill. If patient does not wish to contact the pharmacy document the reason why and proceed with request.) (Agent: If yes, when and what did the pharmacy advise?) No refills left   Is this the correct pharmacy for this prescription? Yes If no, delete pharmacy and type the correct one.  This is the patient's preferred pharmacy:   [id number] [place], [place] - [address] [place] [address] Phone: [phone] Fax: [phone]   Has the prescription been filled recently? No  Is the patient out of the medication? Yes  Has the patient been seen for an appointment in the last year OR does the patient have an upcoming appointment? Yes  Can we respond through MyChart? Yes  Agent: Please be advised that Rx refills may take up to 3 business days. We ask that you follow-up with your pharmacy.

## 2023-08-14 ENCOUNTER — Telehealth: Payer: Self-pay

## 2023-08-14 ENCOUNTER — Other Ambulatory Visit (HOSPITAL_COMMUNITY): Payer: Self-pay

## 2023-08-14 NOTE — Telephone Encounter (Signed)
 Pharmacy Patient Advocate Encounter   Received notification from Physician's Office that prior authorization for Amphetamine -Dextroamphet ER 15MG  er capsules is required/requested.   Insurance verification completed.   The patient is insured through [employer] .   PA required; PA submitted to above mentioned insurance via CoverMyMeds Key/confirmation #/EOC [employer]. Status is pending

## 2023-08-18 NOTE — Telephone Encounter (Signed)
 Pharmacy Patient Advocate Encounter  Received notification from [employer] that Prior Authorization for Amphetamine -Dextroamphet ER 15MG  er capsules has been APPROVED from 418/25 to 02/18/24   PA #/Case ID/Reference #: [id number]

## 2023-08-28 ENCOUNTER — Encounter: Payer: Self-pay | Admitting: Student

## 2023-08-28 ENCOUNTER — Encounter: Payer: Self-pay | Admitting: Internal Medicine

## 2023-08-28 ENCOUNTER — Ambulatory Visit: Payer: Managed Care, Other (non HMO) | Admitting: Student

## 2023-08-28 VITALS — BP 123/61 | HR 111 | Temp 98.5°F | Ht 63.0 in | Wt 152.6 lb

## 2023-08-28 DIAGNOSIS — R768 Other specified abnormal immunological findings in serum: Secondary | ICD-10-CM | POA: Diagnosis not present

## 2023-08-28 DIAGNOSIS — F1190 Opioid use, unspecified, uncomplicated: Secondary | ICD-10-CM

## 2023-08-28 DIAGNOSIS — F32A Depression, unspecified: Secondary | ICD-10-CM

## 2023-08-28 DIAGNOSIS — F988 Other specified behavioral and emotional disorders with onset usually occurring in childhood and adolescence: Secondary | ICD-10-CM

## 2023-08-28 DIAGNOSIS — F419 Anxiety disorder, unspecified: Secondary | ICD-10-CM | POA: Diagnosis not present

## 2023-08-28 DIAGNOSIS — F1121 Opioid dependence, in remission: Secondary | ICD-10-CM

## 2023-08-28 MED ORDER — AMPHETAMINE-DEXTROAMPHETAMINE 5 MG PO TABS: 5.0000 mg | ORAL_TABLET | Freq: Every day | ORAL | 0 refills | Status: DC

## 2023-08-28 MED ORDER — ESCITALOPRAM OXALATE 10 MG PO TABS: 10.0000 mg | ORAL_TABLET | Freq: Every day | ORAL | 1 refills | Status: DC

## 2023-08-28 MED ORDER — AMPHETAMINE-DEXTROAMPHET ER 15 MG PO CP24: 15.0000 mg | ORAL_CAPSULE | Freq: Two times a day (BID) | ORAL | 0 refills | Status: DC

## 2023-08-28 MED ORDER — BUPRENORPHINE HCL-NALOXONE HCL 8-2 MG SL SUBL: SUBLINGUAL_TABLET | SUBLINGUAL | 3 refills | Status: DC

## 2023-08-28 MED ORDER — ESCITALOPRAM OXALATE 20 MG PO TABS: 20.0000 mg | ORAL_TABLET | Freq: Every day | ORAL | 1 refills | Status: DC

## 2023-08-28 NOTE — Assessment & Plan Note (Signed)
 No concerns at this time. Patient reports that he anxiety and depression are well controlled at this time. Patient is currently on 30 mg of Lexapro .  Plan: -Continue lexapro  30 mg daily

## 2023-08-28 NOTE — Patient Instructions (Addendum)
 Peggy Lewis,Thank you for allowing me to take part in your care today.  Here are your instructions.  1. I have refilled your medications. I have started you on an immediate release addreall to take with your extended release in the afternoon. Lets see if this can help. Remember to watch out for side effects of weight loss, jitteriness feeling, and palpitations.   2. Please come back in 3 months and at that time we will see how you are doing.  3. Please call the clinic for refills for the suboxone  if needed. [facility]   Thank you, Dr. Lydia Sams  If you have any other questions please contact the internal medicine clinic at [phone] If it is after hours, please call the [facility] at [phone] and then ask the person who picks up for the resident on call.

## 2023-08-28 NOTE — Progress Notes (Signed)
 CC: ADD and OUD Follow up   HPI:  Peggy Lewis is a 33 y.o. female with a past medical history of ADD, anxiety, depression, OUD on Suboxone  who presents for follow-up appointment regarding ADD and OUD. Please see assessment plan for HPI.  Medications: ADD: Adderall 15 mg twice daily, Adderall 5 mg IR daily prn  OUD: Suboxone  8-2 mg 1 in the morning, half in the afternoon and 1 in the evening Depression/anxiety: Lexapro  20 mg daily GERD: Pepcid  10 mg daily  Past Medical History:  Diagnosis Date   Alcohol abuse    Anxiety    Depression    Heroin abuse (HCC)    Seizures (HCC)    Stroke (HCC)      Current Outpatient Medications:    amphetamine -dextroamphetamine (ADDERALL) 5 MG tablet, Take 1 tablet (5 mg total) by mouth daily., Disp: 30 tablet, Rfl: 0   amphetamine -dextroamphetamine (ADDERALL XR) 15 MG 24 hr capsule, Take 1 capsule by mouth 2 (two) times daily., Disp: 60 capsule, Rfl: 0   [START ON 09/28/2023] buprenorphine -naloxone  (SUBOXONE ) 8-2 mg SUBL SL tablet, PLACE ONE TABLET UNDER THE TONGUE EVERY MORNING, PLACE ONE-HALF TABLET UNDER THE TONGUE EVERY AFTERNOON, AND PLACE ONE TABLET UNDER THE TONGUE EVERY EVENING70, Disp: 70 tablet, Rfl: 3   escitalopram  (LEXAPRO ) 10 MG tablet, Take 1 tablet (10 mg total) by mouth daily., Disp: 90 tablet, Rfl: 1   escitalopram  (LEXAPRO ) 20 MG tablet, Take 1 tablet (20 mg total) by mouth daily., Disp: 90 tablet, Rfl: 1   famotidine  (PEPCID ) 10 MG tablet, Take 1 tablet (10 mg total) by mouth daily., Disp: 90 tablet, Rfl: 1  Review of Systems:   Negative except for what is stated in the HPI  Physical Exam:  Vitals:   08/28/23 1552  BP: 123/61  Pulse: (!) 111  Temp: 98.5 F (36.9 C)  TempSrc: Oral  SpO2: 100%  Weight: 152 lb 9.6 oz (69.2 kg)  Height: 5\' 3"  (1.6 m)   General: Patient is sitting comfortably in the room  Head: Normocephalic, atraumatic  Cardio: Regular rate and rhythm, no murmurs, rubs or gallops Pulmonary: Clear  to ausculation bilaterally with no rales, rhonchi, and crackles    Assessment & Plan:   ADD (attention deficit disorder) Patient presents to the clinic for medication refills today. She endorses that she has been on adderral for a long time and she feels that it is no longer working. She states that currently she take her first 15 mg at 730am and then she takes her next one at 3pm or 4 pm but when she gets home she feels that she is not able to tend to her family and not able to complete chores as her mind is always racing. She requests some additional adderall. Given that patient is already above the reccommended dose of the XR formulation, we decided to try a IR version of the medication to take along with her PM dose. When asking about what happens when she gets home she states that her mind races and she is not able to take care of her daughter and not be able to be present with her wife.   Plan: -Continue Adderall 15 mg BID -Start Adderall 5 mg daily prn with 2nd 15 mg dose -Return in 3 months   -If patient is requiring more dose adjustment it will be out of scope of practice for the [facility] and would need to refer to a psychiatrist   Anxiety and depression No concerns  at this time. Patient reports that he anxiety and depression are well controlled at this time. Patient is currently on 30 mg of Lexapro .  Plan: -Continue lexapro  30 mg daily    Hepatitis C antibody positive Previously had positive Hep C positive antibody but viral load was not detectable. Patient reports that she follows with another provider for this issue and does not want this problem worked up by us . Patient reports that Chryl Crank is who will take care of this issue.   Opioid use disorder, moderate, in sustained remission, on maintenance therapy [facility]) Patient Is currently on suboxone . She takes 8-2 mg 1 in the morning, half in the afternoon and 1 in the evening. She denies any craving, withdrawals, or relapses. She has  no issues with the medications.   Plan: -Continue suboxone  8-2 mg 1 in the morning, half in the afternoon and 1 in the evening -ToxAssure at next visit in 3 months  Patient discussed with Dr. Gilmer Lady, DO PGY-2 Internal Medicine Resident

## 2023-08-28 NOTE — Assessment & Plan Note (Addendum)
 Previously had positive Hep C positive antibody but viral load was not detectable. Patient reports that she follows with another provider for this issue and does not want this problem worked up by us . Patient reports that Chryl Crank is who will take care of this issue.

## 2023-08-28 NOTE — Assessment & Plan Note (Signed)
 Patient Is currently on suboxone . She takes 8-2 mg 1 in the morning, half in the afternoon and 1 in the evening. She denies any craving, withdrawals, or relapses. She has no issues with the medications.   Plan: -Continue suboxone  8-2 mg 1 in the morning, half in the afternoon and 1 in the evening -ToxAssure at next visit in 3 months

## 2023-08-28 NOTE — Assessment & Plan Note (Signed)
 Patient presents to the clinic for medication refills today. She endorses that she has been on adderral for a long time and she feels that it is no longer working. She states that currently she take her first 15 mg at 730am and then she takes her next one at 3pm or 4 pm but when she gets home she feels that she is not able to tend to her family and not able to complete chores as her mind is always racing. She requests some additional adderall. Given that patient is already above the reccommended dose of the XR formulation, we decided to try a IR version of the medication to take along with her PM dose. When asking about what happens when she gets home she states that her mind races and she is not able to take care of her daughter and not be able to be present with her wife.   Plan: -Continue Adderall 15 mg BID -Start Adderall 5 mg daily prn with 2nd 15 mg dose -Return in 3 months   -If patient is requiring more dose adjustment it will be out of scope of practice for the [facility] and would need to refer to a psychiatrist

## 2023-09-14 NOTE — Progress Notes (Signed)
 Internal Medicine Clinic Attending  Case discussed with the resident at the time of the visit.  We reviewed the resident's history and exam and pertinent patient test results.  I agree with the assessment, diagnosis, and plan of care documented in the resident's note.

## 2023-09-18 ENCOUNTER — Emergency Department

## 2023-09-18 ENCOUNTER — Observation Stay
Admission: EM | Admit: 2023-09-18 | Discharge: 2023-09-18 | Disposition: A | Discharge status: Home / Self Care | Attending: Internal Medicine | Admitting: Internal Medicine

## 2023-09-18 ENCOUNTER — Other Ambulatory Visit: Payer: Self-pay

## 2023-09-18 DIAGNOSIS — F909 Attention-deficit hyperactivity disorder, unspecified type: Secondary | ICD-10-CM | POA: Diagnosis not present

## 2023-09-18 DIAGNOSIS — Z79899 Other long term (current) drug therapy: Secondary | ICD-10-CM | POA: Insufficient documentation

## 2023-09-18 DIAGNOSIS — D649 Anemia, unspecified: Secondary | ICD-10-CM | POA: Diagnosis not present

## 2023-09-18 DIAGNOSIS — Z87891 Personal history of nicotine dependence: Secondary | ICD-10-CM | POA: Insufficient documentation

## 2023-09-18 DIAGNOSIS — F419 Anxiety disorder, unspecified: Secondary | ICD-10-CM | POA: Diagnosis not present

## 2023-09-18 DIAGNOSIS — F32A Depression, unspecified: Secondary | ICD-10-CM | POA: Diagnosis not present

## 2023-09-18 DIAGNOSIS — F988 Other specified behavioral and emotional disorders with onset usually occurring in childhood and adolescence: Secondary | ICD-10-CM | POA: Diagnosis not present

## 2023-09-18 DIAGNOSIS — F1121 Opioid dependence, in remission: Secondary | ICD-10-CM | POA: Diagnosis not present

## 2023-09-18 DIAGNOSIS — R600 Localized edema: Secondary | ICD-10-CM

## 2023-09-18 DIAGNOSIS — R609 Edema, unspecified: Secondary | ICD-10-CM

## 2023-09-18 DIAGNOSIS — M7989 Other specified soft tissue disorders: Secondary | ICD-10-CM | POA: Diagnosis present

## 2023-09-18 LAB — BASIC METABOLIC PANEL WITH GFR
Anion gap: 8 (ref 5–15)
BUN: 11 mg/dL (ref 6–20)
CO2: 27 mmol/L (ref 22–32)
Calcium: 8.3 mg/dL — ABNORMAL LOW (ref 8.9–10.3)
Chloride: 101 mmol/L (ref 98–111)
Creatinine, Ser: 0.72 mg/dL (ref 0.44–1.00)
GFR, Estimated: >60 mL/min (ref >60)
Glucose, Bld: 89 mg/dL (ref 70–99)
Potassium: 3.2 mmol/L — ABNORMAL LOW (ref 3.5–5.1)
Sodium: 136 mmol/L (ref 135–145)

## 2023-09-18 LAB — CBC
HCT: 21.4 % — ABNORMAL LOW (ref 36.0–46.0)
HCT: 22.9 % — ABNORMAL LOW (ref 36.0–46.0)
Hemoglobin: 6.4 g/dL — ABNORMAL LOW (ref 12.0–15.0)
Hemoglobin: 7.1 g/dL — ABNORMAL LOW (ref 12.0–15.0)
MCH: 24 pg — ABNORMAL LOW (ref 26.0–34.0)
MCH: 24.9 pg — ABNORMAL LOW (ref 26.0–34.0)
MCHC: 29.9 g/dL — ABNORMAL LOW (ref 30.0–36.0)
MCHC: 31 g/dL (ref 30.0–36.0)
MCV: 80.1 fL (ref 80.0–100.0)
MCV: 80.4 fL (ref 80.0–100.0)
Platelets: 410 10*3/uL — ABNORMAL HIGH (ref 150–400)
Platelets: 356 10*3/uL (ref 150–400)
RBC: 2.67 MIL/uL — ABNORMAL LOW (ref 3.87–5.11)
RBC: 2.85 MIL/uL — ABNORMAL LOW (ref 3.87–5.11)
RDW: 15.1 % (ref 11.5–15.5)
RDW: 14.9 % (ref 11.5–15.5)
WBC: 4 10*3/uL (ref 4.0–10.5)
WBC: 3.9 10*3/uL — ABNORMAL LOW (ref 4.0–10.5)
nRBC: 0 % (ref 0.0–0.2)
nRBC: 0 % (ref 0.0–0.2)

## 2023-09-18 LAB — HEPATIC FUNCTION PANEL
ALT: 25 U/L (ref 0–44)
AST: 31 U/L (ref 15–41)
Albumin: 3.5 g/dL (ref 3.5–5.0)
Alkaline Phosphatase: 41 U/L (ref 38–126)
Bilirubin, Direct: <0.1 mg/dL (ref 0.0–0.2)
Total Bilirubin: 0.2 mg/dL (ref 0.0–1.2)
Total Protein: 6.3 g/dL — ABNORMAL LOW (ref 6.5–8.1)

## 2023-09-18 LAB — IRON AND TIBC
Iron: 9 ug/dL — ABNORMAL LOW (ref 28–170)
Saturation Ratios: 2 % — ABNORMAL LOW (ref 10.4–31.8)
TIBC: 480 ug/dL — ABNORMAL HIGH (ref 250–450)
UIBC: 471 ug/dL

## 2023-09-18 LAB — MAGNESIUM: Magnesium: 2 mg/dL (ref 1.7–2.4)

## 2023-09-18 LAB — PROTIME-INR
INR: 1 (ref 0.8–1.2)
Prothrombin Time: 13.2 s (ref 11.4–15.2)

## 2023-09-18 LAB — FERRITIN: Ferritin: <3 ng/mL — ABNORMAL LOW (ref 11–307)

## 2023-09-18 LAB — HCG, QUANTITATIVE, PREGNANCY: hCG, Beta Chain, Quant, S: <1 m[IU]/mL (ref <5)

## 2023-09-18 LAB — ABO/RH: ABO/RH(D): A POS

## 2023-09-18 LAB — BRAIN NATRIURETIC PEPTIDE: B Natriuretic Peptide: 94.5 pg/mL (ref 0.0–100.0)

## 2023-09-18 MED ORDER — ESCITALOPRAM OXALATE 10 MG PO TABS: 20.0000 mg | ORAL_TABLET | Freq: Every day | ORAL | Status: DC

## 2023-09-18 MED ORDER — POTASSIUM CHLORIDE CRYS ER 20 MEQ PO TBCR
40.0000 meq | EXTENDED_RELEASE_TABLET | Freq: Once | ORAL | Status: AC
  Administered 2023-09-18: 40 meq via ORAL
  Filled 2023-09-18: qty 2

## 2023-09-18 MED ORDER — ACETAMINOPHEN 325 MG PO TABS
650.0000 mg | ORAL_TABLET | Freq: Four times a day (QID) | ORAL | Status: DC | PRN
  Administered 2023-09-18: 650 mg via ORAL
  Filled 2023-09-18: qty 2

## 2023-09-18 MED ORDER — SODIUM CHLORIDE 0.9% FLUSH: 3.0000 mL | Freq: Two times a day (BID) | INTRAVENOUS | Status: DC

## 2023-09-18 MED ORDER — BUPRENORPHINE HCL-NALOXONE HCL 8-2 MG SL SUBL: 0.5000 | SUBLINGUAL_TABLET | Freq: Every day | SUBLINGUAL | Status: DC

## 2023-09-18 MED ORDER — ACETAMINOPHEN 650 MG RE SUPP: 650.0000 mg | Freq: Four times a day (QID) | RECTAL | Status: DC | PRN

## 2023-09-18 MED ORDER — ONDANSETRON HCL 4 MG PO TABS: 4.0000 mg | ORAL_TABLET | Freq: Four times a day (QID) | ORAL | Status: DC | PRN

## 2023-09-18 MED ORDER — ONDANSETRON HCL 4 MG/2ML IJ SOLN
4.0000 mg | Freq: Four times a day (QID) | INTRAMUSCULAR | Status: DC | PRN
Start: 2023-09-18 — End: 2023-09-18

## 2023-09-18 MED ORDER — BUPRENORPHINE HCL-NALOXONE HCL 8-2 MG SL SUBL: 1.0000 | SUBLINGUAL_TABLET | Freq: Two times a day (BID) | SUBLINGUAL | Status: DC

## 2023-09-18 MED ORDER — POLYSACCHARIDE IRON COMPLEX 150 MG PO CAPS
150.0000 mg | ORAL_CAPSULE | Freq: Every day | ORAL | Status: DC
  Administered 2023-09-18: 150 mg via ORAL
  Filled 2023-09-18: qty 1

## 2023-09-18 MED ORDER — PANTOPRAZOLE SODIUM 40 MG IV SOLR
40.0000 mg | Freq: Two times a day (BID) | INTRAVENOUS | Status: DC
  Administered 2023-09-18: 40 mg via INTRAVENOUS
  Filled 2023-09-18: qty 10

## 2023-09-18 MED ORDER — BUPRENORPHINE HCL-NALOXONE HCL 8-2 MG SL SUBL
1.0000 | SUBLINGUAL_TABLET | SUBLINGUAL | Status: AC
  Administered 2023-09-18: 1 via SUBLINGUAL
  Filled 2023-09-18: qty 1

## 2023-09-18 NOTE — Discharge Summary (Signed)
 Physician Discharge Summary   Patient: Peggy Lewis MRN: [id number] DOB: July 25, 1990  Admit date:     09/18/2023  Discharge date: 09/18/23  Discharge Physician: Avi Body   PCP: Fay Hoop, MD   Recommendations at discharge:  Repeat CBC with possible need for additional transfusion  Referral to GI  Discharge Diagnoses: Principal Problem:   Symptomatic anemia Active Problems:   Bilateral lower extremity edema   Opioid use disorder, moderate, in sustained remission, on maintenance therapy (HCC)   Anxiety and depression   ADD (attention deficit disorder)  Resolved Problems:   * No resolved hospital problems. [facility] Course: On arrival to the ED, patient was normotensive at 108/49 with heart rate of 83.  She was saturating at 100% on room air.  She was afebrile at 98.5.Initial workup notable for hemoglobin of 6.4, MCV 80, potassium 3.2, creatinine 0.72, GFR above 60.  INR within normal limits.  CT head, bilateral lower extremity Doppler, right upper quadrant ultrasound all negative.  Patient started on 2 units of packed RBC and [facility] contacted for admission. GI consulted.   After receiving 1 unit of packed RBC, patient left AGAINST MEDICAL ADVICE.   Assessment and Plan: * Symptomatic anemia Patient is presenting with 1 months of fatigue and several days of swelling and dyspnea on exertion, found to be acutely anemic at 6.4, previously 12.  Unclear etiology as she has both history of heavy menstrual cycles, as well as recent hematochezia although her last bowel movement was over a week ago.  Significant daily NSAID use.  History of severe constipation in the setting of Suboxone .  - 1 unit of packed RBC ordered - Posttransfusion CBC - Continue to transfuse for hemoglobin less than 7 - GI consulted; appreciate their recommendations - Protonix  40 mg IV twice daily - Ferritin and iron panel  Bilateral lower extremity edema Patient reports bilateral lower extremity swelling,  dyspnea on exertion that began over the last several days.  On exam, she has +1 pitting edema of the lower extremities.  No prior history of congestive heart disease or cirrhosis.  Given severe anemia, wonder if this may have led to cardiomyopathy.  Lower extremity Dopplers negative  - Hold off on Lasix at this time - BNP pending - Echocardiogram ordered  ADD (attention deficit disorder) - Continue home Adderall as needed  Anxiety and depression - Continue home Lexapro   Opioid use disorder, moderate, in sustained remission, on maintenance therapy (HCC) - Continue home Suboxone    Pain control - [place]  Controlled Substance Reporting System database was reviewed. and patient was instructed, not to drive, operate heavy machinery, perform activities at heights, swimming or participation in water activities or provide baby-sitting services while on Pain, Sleep and Anxiety Medications; until their outpatient Physician has advised to do so again. Also recommended to not to take more than prescribed Pain, Sleep and Anxiety Medications.   Consultants: GI Procedures performed: None  Disposition: Home Diet recommendation:  Regular diet    Condition at discharge: stable  The results of significant diagnostics from this hospitalization (including imaging, microbiology, ancillary and laboratory) are listed below for reference.   Imaging Studies: CT Head Wo Contrast Result Date: 09/18/2023 CLINICAL DATA:  Headache, tension-type headahce for 2-3 days, anterior EXAM: CT HEAD WITHOUT CONTRAST TECHNIQUE: Contiguous axial images were obtained from the base of the skull through the vertex without intravenous contrast. RADIATION DOSE REDUCTION: This exam was performed according to the departmental dose-optimization program which includes automated exposure control,  adjustment of the mA and/or kV according to patient size and/or use of iterative reconstruction technique. COMPARISON:  Remote head CT  05/27/2013 FINDINGS: Brain: No intracranial hemorrhage, mass effect, or midline shift. No hydrocephalus. Incidental cavum septum pellucidum. The basilar cisterns are patent. No evidence of territorial infarct or acute ischemia. No extra-axial or intracranial fluid collection. Vascular: No hyperdense vessel or unexpected calcification. Skull: No fracture or focal lesion. Sinuses/Orbits: Paranasal sinuses and mastoid air cells are clear. The visualized orbits are unremarkable. Other: None. IMPRESSION: Negative noncontrast head CT. Electronically Signed   By: Chadwick Colonel M.D.   On: 09/18/2023 14:52   US  ABDOMEN LIMITED RUQ (LIVER/GB) Result Date: 09/18/2023 CLINICAL DATA:  [id number] Abdominal pain [id number] EXAM: ULTRASOUND ABDOMEN LIMITED RIGHT UPPER QUADRANT COMPARISON:  October 16, 2014 FINDINGS: Gallbladder: No gallstones. No wall thickening or pericholecystic fluid. No sonographic Murphy's sign noted by sonographer. Common bile duct: Diameter: 2 mm Liver: Normal echogenicity. No focal lesion identified. No intrahepatic biliary ductal dilation. Portal vein is patent on color Doppler imaging with normal direction of blood flow towards the liver. Other: None. IMPRESSION: 1. No cholecystolithiasis or changes of acute cholecystitis. 2. Hepatic steatosis. Electronically Signed   By: Rance Burrows M.D.   On: 09/18/2023 13:34   US  Venous Img Lower Unilateral Right Result Date: 09/18/2023 CLINICAL DATA:  Right lower extremity pain and swelling EXAM: RIGHT LOWER EXTREMITY VENOUS DOPPLER ULTRASOUND TECHNIQUE: Gray-scale sonography with compression, as well as color and duplex ultrasound, were performed to evaluate the deep venous system(s) from the level of the common femoral vein through the popliteal and proximal calf veins. COMPARISON:  None Available. FINDINGS: VENOUS Normal compressibility of the common femoral, superficial femoral, and popliteal veins, as well as the visualized calf veins. Visualized portions of  profunda femoral vein and great saphenous vein unremarkable. No filling defects to suggest DVT on grayscale or color Doppler imaging. Doppler waveforms show normal direction of venous flow, normal respiratory plasticity and response to augmentation. Limited views of the contralateral common femoral vein are unremarkable. OTHER None. Limitations: none IMPRESSION: Negative. Electronically Signed   By: Fernando Hoyer M.D.   On: 09/18/2023 13:22   DG Chest 2 View Result Date: 09/18/2023 CLINICAL DATA:  Shortness of breath since yesterday.  Leg swelling. EXAM: CHEST - 2 VIEW COMPARISON:  Limited correlation made with chest CT 05/27/2013. FINDINGS: The heart size and mediastinal contours are normal. The lungs are clear. There is no pleural effusion or pneumothorax. No acute osseous findings are identified. IMPRESSION: No evidence of acute cardiopulmonary process. Electronically Signed   By: Elmon Hagedorn M.D.   On: 09/18/2023 12:25   US  Venous Img Lower Unilateral Left Result Date: 09/18/2023 CLINICAL DATA:  Short of breath, left lower extremity edema EXAM: LEFT LOWER EXTREMITY VENOUS DOPPLER ULTRASOUND TECHNIQUE: Gray-scale sonography with compression, as well as color and duplex ultrasound, were performed to evaluate the deep venous system(s) from the level of the common femoral vein through the popliteal and proximal calf veins. COMPARISON:  None Available. FINDINGS: VENOUS Normal compressibility of the common femoral, superficial femoral, and popliteal veins, as well as the visualized calf veins. Visualized portions of profunda femoral vein and great saphenous vein unremarkable. No filling defects to suggest DVT on grayscale or color Doppler imaging. Doppler waveforms show normal direction of venous flow, normal respiratory plasticity and response to augmentation. Limited views of the contralateral common femoral vein are unremarkable. OTHER None. Limitations: none IMPRESSION: Negative. Electronically Signed    By: Phillip Breach  Marne Sings M.D.   On: 09/18/2023 12:11    Microbiology: No results found for this or any previous visit.  Labs: CBC: Recent Labs  Lab 09/18/23 1058 09/18/23 1657  WBC 4.0 3.9*  HGB 6.4* 7.1*  HCT 21.4* 22.9*  MCV 80.1 80.4  PLT 410* 356   Basic Metabolic Panel: Recent Labs  Lab 09/18/23 1058  NA 136  K 3.2*  CL 101  CO2 27  GLUCOSE 89  BUN 11  CREATININE 0.72  CALCIUM 8.3*  MG 2.0   Liver Function Tests: Recent Labs  Lab 09/18/23 1028  AST 31  ALT 25  ALKPHOS 41  BILITOT 0.2  PROT 6.3*  ALBUMIN 3.5   CBG: No results for input(s): "GLUCAP" in the last 168 hours.  Discharge time spent: less than 30 minutes.  Signed: Avi Body, MD [facility] 09/18/2023

## 2023-09-18 NOTE — Assessment & Plan Note (Signed)
-   Continue home Suboxone 

## 2023-09-18 NOTE — Assessment & Plan Note (Signed)
 Continue home Lexapro

## 2023-09-18 NOTE — ED Provider Notes (Signed)
 Negative noncontrast head CT.    Iver Marker, MD 09/18/23 [phone]

## 2023-09-18 NOTE — Assessment & Plan Note (Addendum)
 Patient reports bilateral lower extremity swelling, dyspnea on exertion that began over the last several days.  On exam, she has +1 pitting edema of the lower extremities.  No prior history of congestive heart disease or cirrhosis.  Given severe anemia, wonder if this may have led to cardiomyopathy.  Lower extremity Dopplers negative  - Hold off on Lasix at this time - BNP pending - Echocardiogram ordered

## 2023-09-18 NOTE — Assessment & Plan Note (Signed)
 Patient is presenting with 1 months of fatigue and several days of swelling and dyspnea on exertion, found to be acutely anemic at 6.4, previously 12.  Unclear etiology as she has both history of heavy menstrual cycles, as well as recent hematochezia although her last bowel movement was over a week ago.  Significant daily NSAID use.  History of severe constipation in the setting of Suboxone .  - 1 unit of packed RBC ordered - Posttransfusion CBC - Continue to transfuse for hemoglobin less than 7 - GI consulted; appreciate their recommendations - Protonix  40 mg IV twice daily - Ferritin and iron panel

## 2023-09-18 NOTE — ED Provider Notes (Signed)
 [facility] Provider Note    Event Date/Time   First MD Initiated Contact with Patient 09/18/23 1130     (approximate)   History   Leg Swelling   HPI  Peggy Lewis is a 33 y.o. female has a history of prior hepatitis C, in remission from opioid abuse.  Patient reports that she has had a few days of leg swelling and it became noticeably worse yesterday.  She has noticed that the veins seem prominent in her legs and they both feel very swollen the left being more than the right.  She is not having any shortness of breath except when she gets up or exerts herself for the last day or 2 she felt very fatigued and tired.  No chest pain.  No fevers or chills.   She has not noticed any dark black or bloody stools.  She does have regular menstrual cycles that are fairly heavy will last 2 to 3 days, no active bleeding  Physical Exam   Triage Vital Signs: ED Triage Vitals  Encounter Vitals Group     BP 09/18/23 1059 (!) 108/49     Systolic BP Percentile --      Diastolic BP Percentile --      Pulse Rate 09/18/23 1059 83     Resp 09/18/23 1059 18     Temp 09/18/23 1059 98.5 F (36.9 C)     Temp src --      SpO2 09/18/23 1059 100 %     Weight 09/18/23 1056 152 lb 9.6 oz (69.2 kg)     Height 09/18/23 1056 5\' 3"  (1.6 m)     Head Circumference --      Peak Flow --      Pain Score 09/18/23 1056 5     Pain Loc --      Pain Education --      Exclude from Growth Chart --     Most recent vital signs: Vitals:   09/18/23 1311 09/18/23 1351  BP: 100/73 108/71  Pulse: 78 67  Resp: 17 15  Temp:  98.4 F (36.9 C)  SpO2: 100% 100%     General: Awake, no distress.  She is very pleasant, pale in complexion CV:  Good peripheral perfusion.  Resp:  Normal effort.  Normal clear bilateral Abd:  No distention.  Soft nontender nondistended.  No obvious signs of ascites or hepatomegaly or denoted Other:  Moderate bilateral lower extremity edema with prominent venous  markings in both lower extremities.  Left slightly greater than right   ED Results / Procedures / Treatments   Labs (all labs ordered are listed, but only abnormal results are displayed) Labs Reviewed  BASIC METABOLIC PANEL WITH GFR - Abnormal; Notable for the following components:      Result Value   Potassium 3.2 (*)    Calcium 8.3 (*)    All other components within normal limits  CBC - Abnormal; Notable for the following components:   RBC 2.67 (*)    Hemoglobin 6.4 (*)    HCT 21.4 (*)    MCH 24.0 (*)    MCHC 29.9 (*)    Platelets 410 (*)    All other components within normal limits  HEPATIC FUNCTION PANEL - Abnormal; Notable for the following components:   Total Protein 6.3 (*)    All other components within normal limits  HCG, QUANTITATIVE, PREGNANCY  PROTIME-INR  MAGNESIUM  BRAIN NATRIURETIC PEPTIDE  FERRITIN  IRON  AND TIBC  POC URINE PREG, ED  TYPE AND SCREEN  PREPARE RBC (CROSSMATCH)  ABO/RH   Noted to have decreased hemoglobin to 6.4 today.  Hypokalemia  EKG  ED ECG REPORT I, Iver Marker, the attending physician, personally viewed and interpreted this ECG.  Date: 09/18/2023 EKG Time: 11 AM Rate: 80 Rhythm: normal sinus rhythm QRS Axis: normal Intervals: normal ST/T Wave abnormalities: normal Narrative Interpretation: no evidence of acute ischemia    RADIOLOGY  Chest x-ray interpreted by me as negative for acute findings     US  ABDOMEN LIMITED RUQ (LIVER/GB) Result Date: 09/18/2023 CLINICAL DATA:  [id number] Abdominal pain [id number] EXAM: ULTRASOUND ABDOMEN LIMITED RIGHT UPPER QUADRANT COMPARISON:  October 16, 2014 FINDINGS: Gallbladder: No gallstones. No wall thickening or pericholecystic fluid. No sonographic Murphy's sign noted by sonographer. Common bile duct: Diameter: 2 mm Liver: Normal echogenicity. No focal lesion identified. No intrahepatic biliary ductal dilation. Portal vein is patent on color Doppler imaging with normal direction of blood flow  towards the liver. Other: None. IMPRESSION: 1. No cholecystolithiasis or changes of acute cholecystitis. 2. Hepatic steatosis. Electronically Signed   By: Rance Burrows M.D.   On: 09/18/2023 13:34   US  Venous Img Lower Unilateral Right Result Date: 09/18/2023 CLINICAL DATA:  Right lower extremity pain and swelling EXAM: RIGHT LOWER EXTREMITY VENOUS DOPPLER ULTRASOUND TECHNIQUE: Gray-scale sonography with compression, as well as color and duplex ultrasound, were performed to evaluate the deep venous system(s) from the level of the common femoral vein through the popliteal and proximal calf veins. COMPARISON:  None Available. FINDINGS: VENOUS Normal compressibility of the common femoral, superficial femoral, and popliteal veins, as well as the visualized calf veins. Visualized portions of profunda femoral vein and great saphenous vein unremarkable. No filling defects to suggest DVT on grayscale or color Doppler imaging. Doppler waveforms show normal direction of venous flow, normal respiratory plasticity and response to augmentation. Limited views of the contralateral common femoral vein are unremarkable. OTHER None. Limitations: none IMPRESSION: Negative. Electronically Signed   By: Fernando Hoyer M.D.   On: 09/18/2023 13:22   DG Chest 2 View Result Date: 09/18/2023 CLINICAL DATA:  Shortness of breath since yesterday.  Leg swelling. EXAM: CHEST - 2 VIEW COMPARISON:  Limited correlation made with chest CT 05/27/2013. FINDINGS: The heart size and mediastinal contours are normal. The lungs are clear. There is no pleural effusion or pneumothorax. No acute osseous findings are identified. IMPRESSION: No evidence of acute cardiopulmonary process. Electronically Signed   By: Elmon Hagedorn M.D.   On: 09/18/2023 12:25   US  Venous Img Lower Unilateral Left Result Date: 09/18/2023 CLINICAL DATA:  Short of breath, left lower extremity edema EXAM: LEFT LOWER EXTREMITY VENOUS DOPPLER ULTRASOUND TECHNIQUE: Gray-scale  sonography with compression, as well as color and duplex ultrasound, were performed to evaluate the deep venous system(s) from the level of the common femoral vein through the popliteal and proximal calf veins. COMPARISON:  None Available. FINDINGS: VENOUS Normal compressibility of the common femoral, superficial femoral, and popliteal veins, as well as the visualized calf veins. Visualized portions of profunda femoral vein and great saphenous vein unremarkable. No filling defects to suggest DVT on grayscale or color Doppler imaging. Doppler waveforms show normal direction of venous flow, normal respiratory plasticity and response to augmentation. Limited views of the contralateral common femoral vein are unremarkable. OTHER None. Limitations: none IMPRESSION: Negative. Electronically Signed   By: Fernando Hoyer M.D.   On: 09/18/2023 12:11      PROCEDURES:  Critical Care performed: No  Procedures   MEDICATIONS ORDERED IN ED: Medications  potassium chloride SA (KLOR-CON M) CR tablet 40 mEq (40 mEq Oral Given 09/18/23 1253)  buprenorphine -naloxone  (SUBOXONE ) 8-2 mg per SL tablet 1 tablet (1 tablet Sublingual Given 09/18/23 1301)     IMPRESSION / MDM / ASSESSMENT AND PLAN / ED COURSE  I reviewed the triage vital signs and the nursing notes.                              Differential diagnosis includes, but is not limited to, possible new onset of edema.  Given the patient's exam findings will exclude DVT, also consideration for cause such as liver disease, third spacing, hypoalbuminemia, medication effect etc. are considered.  She is hemodynamically stable but has symptoms suggestive of symptomatic anemia  Patient's presentation is most consistent with acute complicated illness / injury requiring diagnostic workup.   ----------------------------------------- 2:01 PM on 09/18/2023 ----------------------------------------- Delayed note.  Prior to ordering blood product Patient agreeable to  blood transfusion.  Verbally consents having discussed risks benefits and alternatives, also agreeable to signing consent.  Also discussed and consulted with patient accepted to hospitalist service for further workup as to cause for anemia as well as developing edema.  Thus far no evidence of DVT, right upper quadrant ultrasound shows no obvious evidence of ascites     FINAL CLINICAL IMPRESSION(S) / ED DIAGNOSES   Final diagnoses:  Anemia, unspecified type  Edema, unspecified type     Rx / DC Orders   ED Discharge Orders     None        Note:  This document was prepared using Dragon voice recognition software and may include unintentional dictation errors.   Iver Marker, MD 09/18/23 [phone]

## 2023-09-18 NOTE — ED Triage Notes (Addendum)
 Pt comes with c/o sob, leg swelling and face swelling. Pt states she noticed it last night. Pt states all day yesterday she didn't feel good and stayed in the bed.   Pt works in [employer] and heavy lifting. Pt states pain and swelling left leg and behind the knee with discoloration.

## 2023-09-18 NOTE — ED Notes (Signed)
 [place] at bedside

## 2023-09-18 NOTE — H&P (Signed)
 History and Physical    Patient: Peggy Lewis HKV:[id number] DOB: [birth year] DOA: 09/18/2023 DOS: the patient was seen and examined on 09/18/2023 PCP: Fay Hoop, MD  Patient coming from: Home  Chief Complaint:  Chief Complaint  Patient presents with   Leg Swelling   HPI: Peggy Lewis is a 33 y.o. female with medical history significant of opioid use disorder on Suboxone , ADHD on Adderall, GERD, hepatitis C and SVR, seizure, polysubstance abuse in remission, depression, who presents to the ED due to leg swelling.  Peggy Lewis states that over the last 4 months, she has been experiencing persistent fatigue that she assumed was due to work.  Then over the last several days, she has noticed lower extremity swelling, abdominal distention, facial swelling, and dyspnea on exertion.  She notes that she has chronic severe chronic constipation due to the Suboxone .  Her last bowel movement was 1 week ago, which time she did see some bright red blood.  She is not sure but notes there may have been some melena as well.  She also notes that she has had her menstrual cycle in the last week.  She typically experiences 2-3 days of heavy bleeding.  She notes that she was told in the past that she has a hole in her heart but has not had any recent echocardiogram.  History of early cardiovascular disease disease on the paternal side and von Willebrand's disease on the maternal side.  ED course: On arrival to the ED, patient was normotensive at 108/49 with heart rate of 83.  She was saturating at 100% on room air.  She was afebrile at 98.5.Initial workup notable for hemoglobin of 6.4, MCV 80, potassium 3.2, creatinine 0.72, GFR above 60.  INR within normal limits.  CT head, bilateral lower extremity Doppler, right upper quadrant ultrasound all negative.  Patient started on 2 units of packed RBC and [facility] contacted for admission.  Review of Systems: As mentioned in the history of present illness. All other systems  reviewed and are negative.  Past Medical History:  Diagnosis Date   Alcohol abuse    Anxiety    Depression    Heroin abuse (HCC)    Seizures (HCC)    Stroke [facility])    Past Surgical History:  Procedure Laterality Date   ADENOIDECTOMY     WRIST SURGERY     Social History:  reports that she quit smoking about 4 years ago. Her smoking use included cigarettes. She has never used smokeless tobacco. She reports that she does not currently use alcohol. She reports that she does not currently use drugs.  Allergies  Allergen Reactions   Aspirin Other (See Comments)    GI discomfort   Acetaminophen  Rash and Other (See Comments)    Reaction:  GI upset     Family History  Problem Relation Age of Onset   Diabetes Mother    Depression Father    Drug abuse Father    Heart failure Maternal Grandmother    Colon cancer Neg Hx    Liver disease Neg Hx     Prior to Admission medications   Medication Sig Start Date End Date Taking? Authorizing Provider  amphetamine -dextroamphetamine  (ADDERALL XR) 15 MG 24 hr capsule Take 1 capsule by mouth 2 (two) times daily. 08/28/23   Jonelle Neri, DO  amphetamine -dextroamphetamine  (ADDERALL) 5 MG tablet Take 1 tablet (5 mg total) by mouth daily. 08/28/23 08/27/24  Jonelle Neri, DO  buprenorphine -naloxone  (SUBOXONE ) 8-2 mg SUBL SL tablet  PLACE ONE TABLET UNDER THE TONGUE EVERY MORNING, PLACE ONE-HALF TABLET UNDER THE TONGUE EVERY AFTERNOON, AND PLACE ONE TABLET UNDER THE TONGUE EVERY EVENING70 09/28/23   Jonelle Neri, DO  escitalopram  (LEXAPRO ) 10 MG tablet Take 1 tablet (10 mg total) by mouth daily. 08/28/23   Jonelle Neri, DO  escitalopram  (LEXAPRO ) 20 MG tablet Take 1 tablet (20 mg total) by mouth daily. 08/28/23   Jonelle Neri, DO  famotidine  (PEPCID ) 10 MG tablet Take 1 tablet (10 mg total) by mouth daily. 06/02/22   Carollynn Cirri, NP    Physical Exam: Vitals:   09/18/23 1407 09/18/23 1407 09/18/23 1430 09/18/23 1534  BP: 101/77 101/77 111/80 99/71  Pulse: 74  72 73 64  Resp: 16 13 16 16   Temp: 98.3 F (36.8 C) 98.3 F (36.8 C)  98.2 F (36.8 C)  TempSrc: Oral Oral  Oral  SpO2: 100% 100% 100% 100%  Weight:      Height:       Physical Exam Vitals and nursing note reviewed.  Constitutional:      General: She is not in acute distress.    Appearance: She is normal weight. She is not toxic-appearing.  HENT:     Head: Normocephalic and atraumatic.     Comments: No facial swelling noted Eyes:     Conjunctiva/sclera: Conjunctivae normal.  Cardiovascular:     Rate and Rhythm: Normal rate and regular rhythm.     Heart sounds: No murmur heard.    No gallop.  Pulmonary:     Effort: Pulmonary effort is normal. No respiratory distress.     Breath sounds: Normal breath sounds. No wheezing, rhonchi or rales.  Abdominal:     General: Bowel sounds are normal. There is no distension.     Palpations: Abdomen is soft.     Tenderness: There is no abdominal tenderness. There is no guarding.  Musculoskeletal:     Right lower leg: 1+ Pitting Edema present.     Left lower leg: 1+ Pitting Edema present.  Skin:    General: Skin is warm and dry.  Neurological:     General: No focal deficit present.     Mental Status: She is alert and oriented to person, place, and time. Mental status is at baseline.  Psychiatric:        Mood and Affect: Mood normal.        Behavior: Behavior normal.    Data Reviewed: CBC with WBC of 4.0, hemoglobin of 6.4, MCV of 80, platelets of 410 CMP with sodium of 136, potassium 3.2, bicarb 27, BUN 11, creatinine 0.72, AST 31, ALT 25, GFR above 60 INR 1.0.   EKG personally reviewed.  Sinus rhythm with rate of 80.  No acute ischemic changes.  CT Head Wo Contrast Result Date: 09/18/2023 CLINICAL DATA:  Headache, tension-type headahce for 2-3 days, anterior EXAM: CT HEAD WITHOUT CONTRAST TECHNIQUE: Contiguous axial images were obtained from the base of the skull through the vertex without intravenous contrast. RADIATION DOSE  REDUCTION: This exam was performed according to the departmental dose-optimization program which includes automated exposure control, adjustment of the mA and/or kV according to patient size and/or use of iterative reconstruction technique. COMPARISON:  Remote head CT 05/27/2013 FINDINGS: Brain: No intracranial hemorrhage, mass effect, or midline shift. No hydrocephalus. Incidental cavum septum pellucidum. The basilar cisterns are patent. No evidence of territorial infarct or acute ischemia. No extra-axial or intracranial fluid collection. Vascular: No hyperdense vessel or unexpected calcification. Skull: No fracture  or focal lesion. Sinuses/Orbits: Paranasal sinuses and mastoid air cells are clear. The visualized orbits are unremarkable. Other: None. IMPRESSION: Negative noncontrast head CT. Electronically Signed   By: Chadwick Colonel M.D.   On: 09/18/2023 14:52   US  ABDOMEN LIMITED RUQ (LIVER/GB) Result Date: 09/18/2023 CLINICAL DATA:  [id number] Abdominal pain [id number] EXAM: ULTRASOUND ABDOMEN LIMITED RIGHT UPPER QUADRANT COMPARISON:  October 16, 2014 FINDINGS: Gallbladder: No gallstones. No wall thickening or pericholecystic fluid. No sonographic Murphy's sign noted by sonographer. Common bile duct: Diameter: 2 mm Liver: Normal echogenicity. No focal lesion identified. No intrahepatic biliary ductal dilation. Portal vein is patent on color Doppler imaging with normal direction of blood flow towards the liver. Other: None. IMPRESSION: 1. No cholecystolithiasis or changes of acute cholecystitis. 2. Hepatic steatosis. Electronically Signed   By: Rance Burrows M.D.   On: 09/18/2023 13:34   US  Venous Img Lower Unilateral Right Result Date: 09/18/2023 CLINICAL DATA:  Right lower extremity pain and swelling EXAM: RIGHT LOWER EXTREMITY VENOUS DOPPLER ULTRASOUND TECHNIQUE: Gray-scale sonography with compression, as well as color and duplex ultrasound, were performed to evaluate the deep venous system(s) from the level of  the common femoral vein through the popliteal and proximal calf veins. COMPARISON:  None Available. FINDINGS: VENOUS Normal compressibility of the common femoral, superficial femoral, and popliteal veins, as well as the visualized calf veins. Visualized portions of profunda femoral vein and great saphenous vein unremarkable. No filling defects to suggest DVT on grayscale or color Doppler imaging. Doppler waveforms show normal direction of venous flow, normal respiratory plasticity and response to augmentation. Limited views of the contralateral common femoral vein are unremarkable. OTHER None. Limitations: none IMPRESSION: Negative. Electronically Signed   By: Fernando Hoyer M.D.   On: 09/18/2023 13:22   DG Chest 2 View Result Date: 09/18/2023 CLINICAL DATA:  Shortness of breath since yesterday.  Leg swelling. EXAM: CHEST - 2 VIEW COMPARISON:  Limited correlation made with chest CT 05/27/2013. FINDINGS: The heart size and mediastinal contours are normal. The lungs are clear. There is no pleural effusion or pneumothorax. No acute osseous findings are identified. IMPRESSION: No evidence of acute cardiopulmonary process. Electronically Signed   By: Elmon Hagedorn M.D.   On: 09/18/2023 12:25   US  Venous Img Lower Unilateral Left Result Date: 09/18/2023 CLINICAL DATA:  Short of breath, left lower extremity edema EXAM: LEFT LOWER EXTREMITY VENOUS DOPPLER ULTRASOUND TECHNIQUE: Gray-scale sonography with compression, as well as color and duplex ultrasound, were performed to evaluate the deep venous system(s) from the level of the common femoral vein through the popliteal and proximal calf veins. COMPARISON:  None Available. FINDINGS: VENOUS Normal compressibility of the common femoral, superficial femoral, and popliteal veins, as well as the visualized calf veins. Visualized portions of profunda femoral vein and great saphenous vein unremarkable. No filling defects to suggest DVT on grayscale or color Doppler  imaging. Doppler waveforms show normal direction of venous flow, normal respiratory plasticity and response to augmentation. Limited views of the contralateral common femoral vein are unremarkable. OTHER None. Limitations: none IMPRESSION: Negative. Electronically Signed   By: Fernando Hoyer M.D.   On: 09/18/2023 12:11   Results are pending, will review when available.  Assessment and Plan:  * Symptomatic anemia Patient is presenting with 1 months of fatigue and several days of swelling and dyspnea on exertion, found to be acutely anemic at 6.4, previously 12.  Unclear etiology as she has both history of heavy menstrual cycles, as well as recent hematochezia although  her last bowel movement was over a week ago.  Significant daily NSAID use.  History of severe constipation in the setting of Suboxone .  - 1 unit of packed RBC ordered - Posttransfusion CBC - Continue to transfuse for hemoglobin less than 7 - GI consulted; appreciate their recommendations - Protonix  40 mg IV twice daily - Ferritin and iron panel  Bilateral lower extremity edema Patient reports bilateral lower extremity swelling, dyspnea on exertion that began over the last several days.  On exam, she has +1 pitting edema of the lower extremities.  No prior history of congestive heart disease or cirrhosis.  Given severe anemia, wonder if this may have led to cardiomyopathy.  Lower extremity Dopplers negative  - Hold off on Lasix at this time - BNP pending - Echocardiogram ordered  ADD (attention deficit disorder) - Continue home Adderall as needed  Anxiety and depression - Continue home Lexapro   Opioid use disorder, moderate, in sustained remission, on maintenance therapy (HCC) - Continue home Suboxone   Advance Care Planning:   Code Status: Full Code   Consults: GI  Family Communication: Patient's mother updated at bedside  Severity of Illness: The appropriate patient status for this patient is OBSERVATION.  Observation status is judged to be reasonable and necessary in order to provide the required intensity of service to ensure the patient's safety. The patient's presenting symptoms, physical exam findings, and initial radiographic and laboratory data in the context of their medical condition is felt to place them at decreased risk for further clinical deterioration. Furthermore, it is anticipated that the patient will be medically stable for discharge from the hospital within 2 midnights of admission.   Author: Avi Body, MD 09/18/2023 3:52 PM  For on call review [email or web address].

## 2023-09-18 NOTE — Assessment & Plan Note (Addendum)
-   Continue home Adderall as needed

## 2023-09-18 NOTE — Plan of Care (Signed)

## 2023-09-19 ENCOUNTER — Encounter: Payer: Self-pay | Admitting: Internal Medicine

## 2023-09-19 ENCOUNTER — Ambulatory Visit: Admitting: Internal Medicine

## 2023-09-19 VITALS — BP 110/70 | Ht 63.0 in | Wt 147.4 lb

## 2023-09-19 DIAGNOSIS — D500 Iron deficiency anemia secondary to blood loss (chronic): Secondary | ICD-10-CM

## 2023-09-19 DIAGNOSIS — R600 Localized edema: Secondary | ICD-10-CM

## 2023-09-19 DIAGNOSIS — K921 Melena: Secondary | ICD-10-CM

## 2023-09-19 DIAGNOSIS — K5909 Other constipation: Secondary | ICD-10-CM | POA: Diagnosis not present

## 2023-09-19 DIAGNOSIS — N920 Excessive and frequent menstruation with regular cycle: Secondary | ICD-10-CM

## 2023-09-19 DIAGNOSIS — D649 Anemia, unspecified: Secondary | ICD-10-CM

## 2023-09-19 NOTE — Progress Notes (Unsigned)
 Subjective:    Patient ID: Peggy Lewis, female    DOB: 1990-10-31, 33 y.o.   MRN: [id number]  HPI  Patient presents to clinic today for hospital follow-up.  She presented to the ER yesterday with 1 month history of fatigue, dyspnea on exertion, abdominal bloating and swelling in her legs.  She was found to have an H&H of 6.4/21.4, mildly low potassium at 3.2 and significantly low iron and ferritin.  Chest x-ray was normal.  Bilateral venous Dopplers were normal.  CT head was negative.  RUQ abdominal ultrasound showed fatty liver but otherwise normal.  She was transfused 1 unit of PRBC, hospitalist contacted for admission.  GI was consulted.  She was started on protonix  drip.  Her posttransfusion CBC showed H&H of 7.1/27.4.  She ended up leaving AMA.  Since that time, she has had some improvement in her shortness of breath and fatigue. She reports the swelling in her legs have improved.   Review of Systems   Past Medical History:  Diagnosis Date  . Alcohol abuse   . Anxiety   . Depression   . Heroin abuse (HCC)   . Seizures (HCC)   . Stroke [facility])     Current Outpatient Medications  Medication Sig Dispense Refill  . amphetamine -dextroamphetamine  (ADDERALL XR) 15 MG 24 hr capsule Take 1 capsule by mouth 2 (two) times daily. 60 capsule 0  . amphetamine -dextroamphetamine  (ADDERALL) 5 MG tablet Take 1 tablet (5 mg total) by mouth daily. 30 tablet 0  . [START ON 09/28/2023] buprenorphine -naloxone  (SUBOXONE ) 8-2 mg SUBL SL tablet PLACE ONE TABLET UNDER THE TONGUE EVERY MORNING, PLACE ONE-HALF TABLET UNDER THE TONGUE EVERY AFTERNOON, AND PLACE ONE TABLET UNDER THE TONGUE EVERY EVENING70 70 tablet 3  . escitalopram  (LEXAPRO ) 10 MG tablet Take 1 tablet (10 mg total) by mouth daily. 90 tablet 1  . escitalopram  (LEXAPRO ) 20 MG tablet Take 1 tablet (20 mg total) by mouth daily. 90 tablet 1  . famotidine  (PEPCID ) 10 MG tablet Take 1 tablet (10 mg total) by mouth daily. 90 tablet 1   No current  facility-administered medications for this visit.    Allergies  Allergen Reactions  . Aspirin Other (See Comments)    GI discomfort  . Acetaminophen  Rash and Other (See Comments)    Reaction:  GI upset     Family History  Problem Relation Age of Onset  . Diabetes Mother   . Depression Father   . Drug abuse Father   . Heart failure Maternal Grandmother   . Colon cancer Neg Hx   . Liver disease Neg Hx     Social History   Socioeconomic History  . Marital status: Married    Spouse name: Not on file  . Number of children: 0  . Years of education: Not on file  . Highest education level: GED or equivalent  Occupational History  . Not on file  Tobacco Use  . Smoking status: Former    Current packs/day: 0.00    Types: Cigarettes    Quit date: 2021    Years since quitting: 4.4  . Smokeless tobacco: Never  Vaping Use  . Vaping status: Every Day  . Substances: Nicotine , Flavoring  Substance and Sexual Activity  . Alcohol use: Not Currently    Comment: no alcohol x 67yrs  . Drug use: Not Currently    Comment: history of heroin usage, no drugs x 3 yrs  . Sexual activity: Yes    Partners: Female  Other Topics Concern  . Not on file  Social History Narrative   Lives with her mom, unemployed, no children, she has a girlfriend   Social Drivers of Corporate investment banker Strain: Medium Risk (12/09/2022)   Overall Financial Resource Strain (CARDIA)   . Difficulty of Paying Living Expenses: Somewhat hard  Food Insecurity: No Food Insecurity (09/18/2023)   Hunger Vital Sign   . Worried About Programme researcher, broadcasting/film/video in the Last Year: Never true   . Ran Out of Food in the Last Year: Never true  Transportation Needs: No Transportation Needs (09/18/2023)   PRAPARE - Transportation   . Lack of Transportation (Medical): No   . Lack of Transportation (Non-Medical): No  Physical Activity: Sufficiently Active (12/09/2022)   Exercise Vital Sign   . Days of Exercise per Week: 6 days   .  Minutes of Exercise per Session: 150+ min  Stress: Stress Concern Present (12/09/2022)   [employer] of Occupational Health - Occupational Stress Questionnaire   . Feeling of Stress : Rather much  Social Connections: Unknown (12/09/2022)   Social Connection and Isolation Panel [NHANES]   . Frequency of Communication with Friends and Family: More than three times a week   . Frequency of Social Gatherings with Friends and Family: More than three times a week   . Attends Religious Services: Patient declined   . Active Member of Clubs or Organizations: No   . Attends Banker Meetings: Not on file   . Marital Status: Married  Catering manager Violence: Not At Risk (09/18/2023)   Humiliation, Afraid, Rape, and Kick questionnaire   . Fear of Current or Ex-Partner: No   . Emotionally Abused: No   . Physically Abused: No   . Sexually Abused: No     Constitutional: Denies fever, malaise, fatigue, headache or abrupt weight changes.  HEENT: Denies eye pain, eye redness, ear pain, ringing in the ears, wax buildup, runny nose, nasal congestion, bloody nose, or sore throat. Respiratory: Denies difficulty breathing, shortness of breath, cough or sputum production.   Cardiovascular: Denies chest pain, chest tightness, palpitations or swelling in the hands or feet.  Gastrointestinal: Denies abdominal pain, bloating, constipation, diarrhea or blood in the stool.  GU: Denies urgency, frequency, pain with urination, burning sensation, blood in urine, odor or discharge. Musculoskeletal: Denies decrease in range of motion, difficulty with gait, muscle pain or joint pain and swelling.  Skin: Denies redness, rashes, lesions or ulcercations.  Neurological: Denies dizziness, difficulty with memory, difficulty with speech or problems with balance and coordination.  Psych: Denies anxiety, depression, SI/HI.  No other specific complaints in a complete review of systems (except as listed in HPI  above).      Objective:   Physical Exam  There were no vitals taken for this visit. Wt Readings from Last 3 Encounters:  09/18/23 152 lb 9.6 oz (69.2 kg)  08/28/23 152 lb 9.6 oz (69.2 kg)  05/29/23 154 lb 4.8 oz (70 kg)    General: Appears their stated age, well developed, well nourished in NAD. Skin: Warm, dry and intact. No rashes, lesions or ulcerations noted. HEENT: Head: normal shape and size; Eyes: sclera white, no icterus, conjunctiva pink, PERRLA and EOMs intact; Ears: Tm's gray and intact, normal light reflex; Nose: mucosa pink and moist, septum midline; Throat/Mouth: Teeth present, mucosa pink and moist, no exudate, lesions or ulcerations noted.  Neck:  Neck supple, trachea midline. No masses, lumps or thyromegaly present.  Cardiovascular: Normal  rate and rhythm. S1,S2 noted.  No murmur, rubs or gallops noted. No JVD or BLE edema. No carotid bruits noted. Pulmonary/Chest: Normal effort and positive vesicular breath sounds. No respiratory distress. No wheezes, rales or ronchi noted.  Abdomen: Soft and nontender. Normal bowel sounds. No distention or masses noted. Liver, spleen and kidneys non palpable. Musculoskeletal: Normal range of motion. No signs of joint swelling. No difficulty with gait.  Neurological: Alert and oriented. Cranial nerves II-XII grossly intact. Coordination normal.  Psychiatric: Mood and affect normal. Behavior is normal. Judgment and thought content normal.    BMET    Component Value Date/Time   NA 136 09/18/2023 1058   NA 142 11/09/2013 0119   K 3.2 (L) 09/18/2023 1058   K 2.9 (L) 11/09/2013 0119   CL 101 09/18/2023 1058   CL 107 11/09/2013 0119   CO2 27 09/18/2023 1058   CO2 22 11/09/2013 0119   GLUCOSE 89 09/18/2023 1058   GLUCOSE 78 11/09/2013 0119   BUN 11 09/18/2023 1058   BUN 6 (L) 11/09/2013 0119   CREATININE 0.72 09/18/2023 1058   CREATININE 0.73 06/02/2022 1326   CALCIUM 8.3 (L) 09/18/2023 1058   CALCIUM 8.7 11/09/2013 0119    GFRNONAA >60 09/18/2023 1058   GFRNONAA >60 11/09/2013 0119   GFRAA >60 10/17/2014 0523   GFRAA >60 11/09/2013 0119    Lipid Panel     Component Value Date/Time   CHOL 186 06/02/2022 1326   TRIG 67 06/02/2022 1326   HDL 66 06/02/2022 1326   CHOLHDL 2.8 06/02/2022 1326   LDLCALC 105 (H) 06/02/2022 1326    CBC    Component Value Date/Time   WBC 3.9 (L) 09/18/2023 1657   RBC 2.85 (L) 09/18/2023 1657   HGB 7.1 (L) 09/18/2023 1657   HGB 10.4 (L) 11/09/2013 1259   HCT 22.9 (L) 09/18/2023 1657   HCT 44.4 11/09/2013 0119   PLT 356 09/18/2023 1657   PLT 390 11/09/2013 0119   MCV 80.4 09/18/2023 1657   MCV 90 11/09/2013 0119   MCH 24.9 (L) 09/18/2023 1657   MCHC 31.0 09/18/2023 1657   RDW 14.9 09/18/2023 1657   RDW 13.6 11/09/2013 0119   LYMPHSABS 2.9 10/16/2014 1050   LYMPHSABS 2.2 04/06/2013 1943   MONOABS 0.5 10/16/2014 1050   MONOABS 0.4 04/06/2013 1943   EOSABS 0.5 10/16/2014 1050   EOSABS 0.4 04/06/2013 1943   BASOSABS 0.1 10/16/2014 1050   BASOSABS 0.0 04/06/2013 1943    Hgb A1C No results found for: "HGBA1C"          Assessment & Plan:   TCM hospital follow-up for symptomatic anemia:  Hospital notes, labs and imaging reviewed

## 2023-09-20 ENCOUNTER — Encounter: Payer: Self-pay | Admitting: Internal Medicine

## 2023-09-20 NOTE — Patient Instructions (Signed)

## 2023-09-21 LAB — BPAM RBC
Blood Product Expiration Date: 202507022359
Blood Product Expiration Date: 202507062359
ISSUE DATE / TIME: 202506021338
Unit Type and Rh: 6200
Unit Type and Rh: 6200

## 2023-09-21 LAB — TYPE AND SCREEN
ABO/RH(D): A POS
Antibody Screen: NEGATIVE
Unit division: 0
Unit division: 0

## 2023-09-21 LAB — PREPARE RBC (CROSSMATCH)

## 2023-09-26 ENCOUNTER — Inpatient Hospital Stay

## 2023-09-26 ENCOUNTER — Inpatient Hospital Stay: Attending: Oncology | Admitting: Oncology

## 2023-09-26 ENCOUNTER — Encounter: Payer: Self-pay | Admitting: Oncology

## 2023-09-26 VITALS — BP 102/63 | HR 93 | Temp 99.0°F | Resp 18 | Ht 63.0 in | Wt 149.7 lb

## 2023-09-26 VITALS — BP 106/76 | HR 75

## 2023-09-26 DIAGNOSIS — D509 Iron deficiency anemia, unspecified: Secondary | ICD-10-CM | POA: Insufficient documentation

## 2023-09-26 DIAGNOSIS — D508 Other iron deficiency anemias: Secondary | ICD-10-CM

## 2023-09-26 DIAGNOSIS — F17210 Nicotine dependence, cigarettes, uncomplicated: Secondary | ICD-10-CM | POA: Insufficient documentation

## 2023-09-26 DIAGNOSIS — R5383 Other fatigue: Secondary | ICD-10-CM | POA: Diagnosis not present

## 2023-09-26 MED ORDER — IRON SUCROSE 20 MG/ML IV SOLN
200.0000 mg | INTRAVENOUS | Status: DC
  Administered 2023-09-26: 200 mg via INTRAVENOUS
  Filled 2023-09-26: qty 10

## 2023-09-26 NOTE — Progress Notes (Signed)
 Hematology/Oncology Consult note [facility] [phone] [phone]  Patient Care Team: Fay Hoop, MD as PCP - General (Internal Medicine) Priscella Brooms, DO as Consulting Physician (Addiction Medicine)   Name of the patient: Peggy Lewis  [id number]  Nov 17, 1990    Reason for referral-iron  deficiency anemia   Referring physician-Dr. Fay Hoop  Date of visit: 09/26/23   History of presenting illness-patient is a 33 year old female with a past medical history significant for hyperlipidemia, GERD, anxiety and depressionWho has been referred for iron  deficiency anemia.  Labs from 09/18/2023 showed H&H of 6.4/21.4 with an MCV of 80.1.  Platelets mildly elevated at 410 and white count normal at 4.  Ferritin levels were less than 3 and they were 3 a year ago.  Iron  saturation was low at 2% with elevated TIBC of 480.  She has been referred to GI by her primary care doctor but is yet to hear from them.  She reports taking BC powder once at night.  She has been on famotidine  as well.  Menstrual cycles last for about 4 days out of which the first 3 days have been heavy.  She has not seen GYN recently.  Denies any blood loss in her stool or urine.  Denies any dark melanotic stool.  Her uncle has been diagnosed with colon cancer and is presently going through treatments.  She is on Suboxone  for history of opioid use disorder and therefore has constipation secondary to it but has been trying to take oral iron   ECOG PS- 1  Pain scale- 0   Review of systems- Review of Systems  Constitutional:  Positive for malaise/fatigue. Negative for chills, fever and weight loss.  HENT:  Negative for congestion, ear discharge and nosebleeds.   Eyes:  Negative for blurred vision.  Respiratory:  Negative for cough, hemoptysis, sputum production, shortness of breath and wheezing.   Cardiovascular:  Negative for chest pain, palpitations, orthopnea and claudication.   Gastrointestinal:  Positive for constipation. Negative for abdominal pain, blood in stool, diarrhea, heartburn, melena, nausea and vomiting.  Genitourinary:  Negative for dysuria, flank pain, frequency, hematuria and urgency.  Musculoskeletal:  Negative for back pain, joint pain and myalgias.  Skin:  Negative for rash.  Neurological:  Negative for dizziness, tingling, focal weakness, seizures, weakness and headaches.  Endo/Heme/Allergies:  Does not bruise/bleed easily.  Psychiatric/Behavioral:  Negative for depression and suicidal ideas. The patient does not have insomnia.     Allergies  Allergen Reactions   Aspirin Other (See Comments)    GI discomfort   Acetaminophen  Rash and Other (See Comments)    Reaction:  GI upset     Patient Active Problem List   Diagnosis Date Noted   Symptomatic anemia 09/18/2023   Bilateral lower extremity edema 09/18/2023   Hepatitis C antibody positive 08/28/2023   Benign mole 03/28/2023   Pure hypercholesterolemia 12/13/2022   ADD (attention deficit disorder) 12/06/2021   GERD (gastroesophageal reflux disease) 12/06/2021   History of alcohol abuse 04/20/2021   Opioid use disorder, moderate, in sustained remission, on maintenance therapy (HCC) 04/20/2021   Anxiety and depression 11/19/2013     Past Medical History:  Diagnosis Date   Alcohol abuse    Anxiety    Depression    Heroin abuse (HCC)    Seizures (HCC)    Stroke [facility])      Past Surgical History:  Procedure Laterality Date   ADENOIDECTOMY     WRIST SURGERY  Social History   Socioeconomic History   Marital status: Married    Spouse name: Amethyst Gainer   Number of children: 1   Years of education: Not on file   Highest education level: GED or equivalent  Occupational History   Occupation: [employer]  Tobacco Use   Smoking status: Former    Current packs/day: 0.00    Types: Cigarettes    Quit date: 2021    Years since quitting: 4.4   Smokeless tobacco: Never   Vaping Use   Vaping status: Every Day   Substances: Nicotine , Flavoring  Substance and Sexual Activity   Alcohol use: Not Currently    Comment: no alcohol x 20yrs   Drug use: Not Currently    Comment: history of heroin usage, no drugs x 3 yrs   Sexual activity: Yes    Partners: Female  Other Topics Concern   Not on file  Social History Narrative   Lives with wife & daughter.    Social Drivers of Health   Financial Resource Strain: Medium Risk (12/09/2022)   Overall Financial Resource Strain (CARDIA)    Difficulty of Paying Living Expenses: Somewhat hard  Food Insecurity: No Food Insecurity (09/26/2023)   Hunger Vital Sign    Worried About Running Out of Food in the Last Year: Never true    Ran Out of Food in the Last Year: Never true  Transportation Needs: No Transportation Needs (09/18/2023)   PRAPARE - Administrator, Civil Service (Medical): No    Lack of Transportation (Non-Medical): No  Physical Activity: Sufficiently Active (12/09/2022)   Exercise Vital Sign    Days of Exercise per Week: 6 days    Minutes of Exercise per Session: 150+ min  Stress: Stress Concern Present (12/09/2022)   [employer] of Occupational Health - Occupational Stress Questionnaire    Feeling of Stress : Rather much  Social Connections: Unknown (12/09/2022)   Social Connection and Isolation Panel [NHANES]    Frequency of Communication with Friends and Family: More than three times a week    Frequency of Social Gatherings with Friends and Family: More than three times a week    Attends Religious Services: Patient declined    Database administrator or Organizations: No    Attends Engineer, structural: Not on file    Marital Status: Married  Catering manager Violence: Not At Risk (09/26/2023)   Humiliation, Afraid, Rape, and Kick questionnaire    Fear of Current or Ex-Partner: No    Emotionally Abused: No    Physically Abused: No    Sexually Abused: No     Family History   Problem Relation Age of Onset   Diabetes Mother    Thrombocytopenia Mother    Depression Father    Drug abuse Father    Heart attack Father    Heart failure Maternal Grandmother    Skin cancer Paternal Grandmother    Colon cancer Neg Hx    Liver disease Neg Hx      Current Outpatient Medications:    doxycycline (MONODOX) 50 MG capsule, Take 50 mg by mouth 2 (two) times daily., Disp: , Rfl:    amphetamine -dextroamphetamine  (ADDERALL XR) 15 MG 24 hr capsule, Take 1 capsule by mouth 2 (two) times daily., Disp: 60 capsule, Rfl: 0   amphetamine -dextroamphetamine  (ADDERALL) 5 MG tablet, Take 1 tablet (5 mg total) by mouth daily., Disp: 30 tablet, Rfl: 0   [START ON 09/28/2023] buprenorphine -naloxone  (SUBOXONE ) 8-2 mg SUBL  SL tablet, PLACE ONE TABLET UNDER THE TONGUE EVERY MORNING, PLACE ONE-HALF TABLET UNDER THE TONGUE EVERY AFTERNOON, AND PLACE ONE TABLET UNDER THE TONGUE EVERY EVENING70, Disp: 70 tablet, Rfl: 3   escitalopram  (LEXAPRO ) 10 MG tablet, Take 1 tablet (10 mg total) by mouth daily., Disp: 90 tablet, Rfl: 1   escitalopram  (LEXAPRO ) 20 MG tablet, Take 1 tablet (20 mg total) by mouth daily., Disp: 90 tablet, Rfl: 1   famotidine  (PEPCID ) 10 MG tablet, Take 1 tablet (10 mg total) by mouth daily., Disp: 90 tablet, Rfl: 1   Physical exam:  Vitals:   09/26/23 1113  BP: 102/63  Pulse: 93  Resp: 18  Temp: 99 F (37.2 C)  TempSrc: Tympanic  SpO2: 100%  Weight: 149 lb 11.2 oz (67.9 kg)  Height: 5\' 3"  (1.6 m)   Physical Exam Cardiovascular:     Rate and Rhythm: Normal rate and regular rhythm.     Heart sounds: Normal heart sounds.  Pulmonary:     Effort: Pulmonary effort is normal.     Breath sounds: Normal breath sounds.  Abdominal:     General: Bowel sounds are normal.     Palpations: Abdomen is soft.  Musculoskeletal:     Cervical back: Normal range of motion.  Skin:    General: Skin is warm and dry.  Neurological:     Mental Status: She is alert and oriented to  person, place, and time.           Latest Ref Rng & Units 09/18/2023   10:58 AM  CMP  Glucose 70 - 99 mg/dL 89   BUN 6 - 20 mg/dL 11   Creatinine 0.98 - 1.00 mg/dL 1.19   Sodium 147 - 829 mmol/L 136   Potassium 3.5 - 5.1 mmol/L 3.2   Chloride 98 - 111 mmol/L 101   CO2 22 - 32 mmol/L 27   Calcium 8.9 - 10.3 mg/dL 8.3       Latest Ref Rng & Units 09/18/2023    4:57 PM  CBC  WBC 4.0 - 10.5 K/uL 3.9   Hemoglobin 12.0 - 15.0 g/dL 7.1   Hematocrit 56.2 - 46.0 % 22.9   Platelets 150 - 400 K/uL 356     No images are attached to the encounter.  CT Head Wo Contrast Result Date: 09/18/2023 CLINICAL DATA:  Headache, tension-type headahce for 2-3 days, anterior EXAM: CT HEAD WITHOUT CONTRAST TECHNIQUE: Contiguous axial images were obtained from the base of the skull through the vertex without intravenous contrast. RADIATION DOSE REDUCTION: This exam was performed according to the departmental dose-optimization program which includes automated exposure control, adjustment of the mA and/or kV according to patient size and/or use of iterative reconstruction technique. COMPARISON:  Remote head CT 05/27/2013 FINDINGS: Brain: No intracranial hemorrhage, mass effect, or midline shift. No hydrocephalus. Incidental cavum septum pellucidum. The basilar cisterns are patent. No evidence of territorial infarct or acute ischemia. No extra-axial or intracranial fluid collection. Vascular: No hyperdense vessel or unexpected calcification. Skull: No fracture or focal lesion. Sinuses/Orbits: Paranasal sinuses and mastoid air cells are clear. The visualized orbits are unremarkable. Other: None. IMPRESSION: Negative noncontrast head CT. Electronically Signed   By: Chadwick Colonel M.D.   On: 09/18/2023 14:52   US  ABDOMEN LIMITED RUQ (LIVER/GB) Result Date: 09/18/2023 CLINICAL DATA:  [id number] Abdominal pain [id number] EXAM: ULTRASOUND ABDOMEN LIMITED RIGHT UPPER QUADRANT COMPARISON:  October 16, 2014 FINDINGS: Gallbladder: No  gallstones. No wall thickening or pericholecystic fluid. No sonographic Murphy's  sign noted by sonographer. Common bile duct: Diameter: 2 mm Liver: Normal echogenicity. No focal lesion identified. No intrahepatic biliary ductal dilation. Portal vein is patent on color Doppler imaging with normal direction of blood flow towards the liver. Other: None. IMPRESSION: 1. No cholecystolithiasis or changes of acute cholecystitis. 2. Hepatic steatosis. Electronically Signed   By: Rance Burrows M.D.   On: 09/18/2023 13:34   US  Venous Img Lower Unilateral Right Result Date: 09/18/2023 CLINICAL DATA:  Right lower extremity pain and swelling EXAM: RIGHT LOWER EXTREMITY VENOUS DOPPLER ULTRASOUND TECHNIQUE: Gray-scale sonography with compression, as well as color and duplex ultrasound, were performed to evaluate the deep venous system(s) from the level of the common femoral vein through the popliteal and proximal calf veins. COMPARISON:  None Available. FINDINGS: VENOUS Normal compressibility of the common femoral, superficial femoral, and popliteal veins, as well as the visualized calf veins. Visualized portions of profunda femoral vein and great saphenous vein unremarkable. No filling defects to suggest DVT on grayscale or color Doppler imaging. Doppler waveforms show normal direction of venous flow, normal respiratory plasticity and response to augmentation. Limited views of the contralateral common femoral vein are unremarkable. OTHER None. Limitations: none IMPRESSION: Negative. Electronically Signed   By: Fernando Hoyer M.D.   On: 09/18/2023 13:22   DG Chest 2 View Result Date: 09/18/2023 CLINICAL DATA:  Shortness of breath since yesterday.  Leg swelling. EXAM: CHEST - 2 VIEW COMPARISON:  Limited correlation made with chest CT 05/27/2013. FINDINGS: The heart size and mediastinal contours are normal. The lungs are clear. There is no pleural effusion or pneumothorax. No acute osseous findings are identified.  IMPRESSION: No evidence of acute cardiopulmonary process. Electronically Signed   By: Elmon Hagedorn M.D.   On: 09/18/2023 12:25   US  Venous Img Lower Unilateral Left Result Date: 09/18/2023 CLINICAL DATA:  Short of breath, left lower extremity edema EXAM: LEFT LOWER EXTREMITY VENOUS DOPPLER ULTRASOUND TECHNIQUE: Gray-scale sonography with compression, as well as color and duplex ultrasound, were performed to evaluate the deep venous system(s) from the level of the common femoral vein through the popliteal and proximal calf veins. COMPARISON:  None Available. FINDINGS: VENOUS Normal compressibility of the common femoral, superficial femoral, and popliteal veins, as well as the visualized calf veins. Visualized portions of profunda femoral vein and great saphenous vein unremarkable. No filling defects to suggest DVT on grayscale or color Doppler imaging. Doppler waveforms show normal direction of venous flow, normal respiratory plasticity and response to augmentation. Limited views of the contralateral common femoral vein are unremarkable. OTHER None. Limitations: none IMPRESSION: Negative. Electronically Signed   By: Fernando Hoyer M.D.   On: 09/18/2023 12:11    Assessment and plan- Patient is a 33 y.o. female referred for iron  deficiency anemia  Patient's labs done  a week ago showed significant anemia with a hemoglobin of 6.4 and labs suggestive of iron  deficiency.  I discussed risks and benefits of IV iron  including all but not limited to possible risk of infusion reaction.  As per her insurance we will proceed with 5 doses of Venofer with the first dose to be given today.  I will repeat her CBC along with B12 folate and TSH in 2 weeks time.  CBC ferritin and iron  studies in 2 months and see me thereafter.  With regards to etiology of iron  deficiency anemia patient is waiting to hear back from GI.  If her GI workup is negative I have encouraged her to follow-up with GYN.  We will discuss this in more  detail when I see her back in 2 months and hopefully she would have seen GI by them.  I have asked her to curtail the use of BC powder.   Thank you for this kind referral and the opportunity to participate in the care of this  Patient   Visit Diagnosis 1. Other iron  deficiency anemia     Dr. Seretha Dance, MD, MPH [facility] at [facility] [phone] 09/26/2023

## 2023-09-27 ENCOUNTER — Encounter: Payer: Self-pay | Admitting: Student

## 2023-10-03 ENCOUNTER — Telehealth: Payer: Self-pay | Admitting: Student

## 2023-10-03 DIAGNOSIS — F909 Attention-deficit hyperactivity disorder, unspecified type: Secondary | ICD-10-CM

## 2023-10-03 MED ORDER — AMPHETAMINE-DEXTROAMPHETAMINE 5 MG PO TABS: 5.0000 mg | ORAL_TABLET | Freq: Every day | ORAL | 0 refills | Status: DC

## 2023-10-03 NOTE — Telephone Encounter (Signed)
 Copied from CRM [id number]. Topic: Clinical - Medication Refill >> Oct 03, 2023  1:10 PM Tiffany H wrote: Medication: amphetamine -dextroamphetamine  (ADDERALL) 5 MG tablet [[id number]]   Patient submitted original refill request on 09/27/23. Please expedite. Patient has been out of this medication for over 5 days.   Has the patient contacted their pharmacy? Yes (Agent: If no, request that the patient contact the pharmacy for the refill. If patient does not wish to contact the pharmacy document the reason why and proceed with request.) (Agent: If yes, when and what did the pharmacy advise?)  This is the patient's preferred pharmacy:  [id number] [place], [place] - [address] [place] [place] [address] Phone: [phone] Fax: [phone]  Is this the correct pharmacy for this prescription? Yes If no, delete pharmacy and type the correct one.   Has the prescription been filled recently? No  Is the patient out of the medication? Yes  Has the patient been seen for an appointment in the last year OR does the patient have an upcoming appointment? Yes  Can we respond through MyChart? Yes  Agent: Please be advised that Rx refills may take up to 3 business days. We ask that you follow-up with your pharmacy.

## 2023-10-03 NOTE — Telephone Encounter (Signed)
 Plan from 08/28/23 office visit reviewed. PDMP reviewed. Refill appropriate.  Adria Hopkins MD 10/03/2023, 4:48 PM

## 2023-10-05 ENCOUNTER — Inpatient Hospital Stay

## 2023-10-05 ENCOUNTER — Encounter: Payer: Self-pay | Admitting: Oncology

## 2023-10-05 VITALS — BP 105/60 | HR 82 | Temp 97.9°F | Resp 18

## 2023-10-05 DIAGNOSIS — D509 Iron deficiency anemia, unspecified: Secondary | ICD-10-CM | POA: Diagnosis not present

## 2023-10-05 DIAGNOSIS — D508 Other iron deficiency anemias: Secondary | ICD-10-CM

## 2023-10-05 MED ORDER — SODIUM CHLORIDE 0.9% FLUSH
10.0000 mL | Freq: Once | INTRAVENOUS | Status: AC | PRN
  Administered 2023-10-05: 10 mL
  Filled 2023-10-05: qty 10

## 2023-10-05 MED ORDER — IRON SUCROSE 20 MG/ML IV SOLN
200.0000 mg | INTRAVENOUS | Status: DC
  Administered 2023-10-05: 200 mg via INTRAVENOUS
  Filled 2023-10-05: qty 10

## 2023-10-10 ENCOUNTER — Inpatient Hospital Stay

## 2023-10-10 DIAGNOSIS — D509 Iron deficiency anemia, unspecified: Secondary | ICD-10-CM | POA: Diagnosis not present

## 2023-10-10 DIAGNOSIS — D508 Other iron deficiency anemias: Secondary | ICD-10-CM

## 2023-10-10 LAB — CBC (CANCER CENTER ONLY)
HCT: 28.9 % — ABNORMAL LOW (ref 36.0–46.0)
Hemoglobin: 8.7 g/dL — ABNORMAL LOW (ref 12.0–15.0)
MCH: 23.8 pg — ABNORMAL LOW (ref 26.0–34.0)
MCHC: 30.1 g/dL (ref 30.0–36.0)
MCV: 79.2 fL — ABNORMAL LOW (ref 80.0–100.0)
Platelet Count: 393 10*3/uL (ref 150–400)
RBC: 3.65 MIL/uL — ABNORMAL LOW (ref 3.87–5.11)
RDW: 17.1 % — ABNORMAL HIGH (ref 11.5–15.5)
WBC Count: 3.8 10*3/uL — ABNORMAL LOW (ref 4.0–10.5)
nRBC: 0 % (ref 0.0–0.2)

## 2023-10-10 LAB — TSH: TSH: 0.897 u[IU]/mL (ref 0.350–4.500)

## 2023-10-10 LAB — VITAMIN B12: Vitamin B-12: 264 pg/mL (ref 180–914)

## 2023-10-10 LAB — FOLATE: Folate: 13.9 ng/mL (ref >5.9)

## 2023-10-12 ENCOUNTER — Ambulatory Visit: Payer: Self-pay | Admitting: Oncology

## 2023-10-12 ENCOUNTER — Inpatient Hospital Stay

## 2023-10-12 VITALS — BP 100/63 | HR 74 | Temp 98.9°F | Resp 18

## 2023-10-12 DIAGNOSIS — E538 Deficiency of other specified B group vitamins: Secondary | ICD-10-CM

## 2023-10-12 DIAGNOSIS — D508 Other iron deficiency anemias: Secondary | ICD-10-CM

## 2023-10-12 DIAGNOSIS — D509 Iron deficiency anemia, unspecified: Secondary | ICD-10-CM | POA: Diagnosis not present

## 2023-10-12 MED ORDER — SODIUM CHLORIDE 0.9% FLUSH
10.0000 mL | Freq: Once | INTRAVENOUS | Status: AC | PRN
  Administered 2023-10-12: 10 mL
  Filled 2023-10-12: qty 10

## 2023-10-12 MED ORDER — IRON SUCROSE 20 MG/ML IV SOLN
200.0000 mg | INTRAVENOUS | Status: DC
  Administered 2023-10-12: 200 mg via INTRAVENOUS
  Filled 2023-10-12: qty 10

## 2023-10-13 ENCOUNTER — Encounter: Payer: Self-pay | Admitting: Oncology

## 2023-10-13 MED ORDER — BD SAFETYGLIDE SYRINGE/NEEDLE 25G X 1" 3 ML MISC: 1 refills | Status: AC

## 2023-10-13 MED ORDER — CYANOCOBALAMIN 1000 MCG/ML IJ SOLN: 1000.0000 ug | INTRAMUSCULAR | 1 refills | Status: DC

## 2023-10-13 NOTE — Telephone Encounter (Signed)
-----   Message from Annah JAYSON Skene sent at 10/12/2023  6:28 PM EDT ----- B12 levels are low. Please ask her if she wants to come to cancer center for monthly injections or self administer. She can alternatively take po b12 1000 mcg daily and see if it works ----- Message ----- From: Doctor, hospital, Lab In [place] Sent: 10/10/2023   1:24 PM EDT To: Annah JAYSON Skene, MD

## 2023-10-13 NOTE — Telephone Encounter (Addendum)
 Per Dr. Melanee B12 levels are low. Please ask her if she wants to come to cancer center for monthly injections or self administer. She can alternatively take po b12 1000 mcg daily and see if it works.   Outbound call; informed of above.  Patient states she would like to administer b12 injections at home; [facility]

## 2023-10-18 ENCOUNTER — Inpatient Hospital Stay

## 2023-10-18 ENCOUNTER — Inpatient Hospital Stay: Attending: Oncology

## 2023-10-18 VITALS — BP 104/73 | HR 76 | Temp 97.7°F | Resp 18

## 2023-10-18 DIAGNOSIS — D509 Iron deficiency anemia, unspecified: Secondary | ICD-10-CM | POA: Insufficient documentation

## 2023-10-18 DIAGNOSIS — D508 Other iron deficiency anemias: Secondary | ICD-10-CM

## 2023-10-18 DIAGNOSIS — F17210 Nicotine dependence, cigarettes, uncomplicated: Secondary | ICD-10-CM | POA: Insufficient documentation

## 2023-10-18 MED ORDER — IRON SUCROSE 20 MG/ML IV SOLN
200.0000 mg | INTRAVENOUS | Status: DC
  Administered 2023-10-18: 200 mg via INTRAVENOUS

## 2023-10-18 MED ORDER — SODIUM CHLORIDE 0.9% FLUSH
10.0000 mL | Freq: Once | INTRAVENOUS | Status: AC | PRN
  Administered 2023-10-18: 10 mL
  Filled 2023-10-18: qty 10

## 2023-10-19 ENCOUNTER — Inpatient Hospital Stay

## 2023-10-24 ENCOUNTER — Other Ambulatory Visit: Payer: Self-pay | Admitting: Student

## 2023-10-24 DIAGNOSIS — F909 Attention-deficit hyperactivity disorder, unspecified type: Secondary | ICD-10-CM

## 2023-10-24 NOTE — Telephone Encounter (Unsigned)
 Copied from CRM [id number]. Topic: Clinical - Medication Refill >> Oct 24, 2023 10:52 AM Susanna ORN wrote: Medication: amphetamine -dextroamphetamine  (ADDERALL XR) 15 MG 24 hr capsule & amphetamine -dextroamphetamine  (ADDERALL) 5 MG tablet  Has the patient contacted their pharmacy? No (Agent: If no, request that the patient contact the pharmacy for the refill. If patient does not wish to contact the pharmacy document the reason why and proceed with request.) (Agent: If yes, when and what did the pharmacy advise?)  This is the patient's preferred pharmacy:  [id number] [place], [place] - [address] [address] Phone: [phone] Fax: [phone]  Is this the correct pharmacy for this prescription? Yes If no, delete pharmacy and type the correct one.   Has the prescription been filled recently? Yes  Is the patient out of the medication? Yes  Has the patient been seen for an appointment in the last year OR does the patient have an upcoming appointment? Yes  Can we respond through MyChart? Yes  Agent: Please be advised that Rx refills may take up to 3 business days. We ask that you follow-up with your pharmacy.

## 2023-10-25 MED ORDER — AMPHETAMINE-DEXTROAMPHETAMINE 5 MG PO TABS
5.0000 mg | ORAL_TABLET | Freq: Every day | ORAL | 0 refills | Status: DC
Start: 2023-10-25 — End: 2023-11-24

## 2023-10-25 MED ORDER — AMPHETAMINE-DEXTROAMPHET ER 15 MG PO CP24: 15.0000 mg | ORAL_CAPSULE | Freq: Two times a day (BID) | ORAL | 0 refills | Status: DC

## 2023-10-26 ENCOUNTER — Inpatient Hospital Stay

## 2023-10-26 VITALS — BP 106/66 | HR 75 | Temp 97.3°F | Resp 18

## 2023-10-26 DIAGNOSIS — D508 Other iron deficiency anemias: Secondary | ICD-10-CM

## 2023-10-26 DIAGNOSIS — D509 Iron deficiency anemia, unspecified: Secondary | ICD-10-CM | POA: Diagnosis not present

## 2023-10-26 MED ORDER — SODIUM CHLORIDE 0.9% FLUSH
10.0000 mL | Freq: Once | INTRAVENOUS | Status: AC | PRN
Start: 2023-10-26 — End: 2023-10-26
  Administered 2023-10-26: 10 mL
  Filled 2023-10-26: qty 10

## 2023-10-26 MED ORDER — IRON SUCROSE 20 MG/ML IV SOLN
200.0000 mg | INTRAVENOUS | Status: DC
  Administered 2023-10-26: 200 mg via INTRAVENOUS
  Filled 2023-10-26: qty 10

## 2023-11-10 ENCOUNTER — Encounter: Payer: Self-pay | Admitting: Internal Medicine

## 2023-11-14 ENCOUNTER — Encounter: Payer: Self-pay | Admitting: Internal Medicine

## 2023-11-14 ENCOUNTER — Telehealth: Admitting: Internal Medicine

## 2023-11-14 DIAGNOSIS — Z0289 Encounter for other administrative examinations: Secondary | ICD-10-CM | POA: Diagnosis not present

## 2023-11-14 DIAGNOSIS — K5909 Other constipation: Secondary | ICD-10-CM | POA: Diagnosis not present

## 2023-11-14 DIAGNOSIS — D500 Iron deficiency anemia secondary to blood loss (chronic): Secondary | ICD-10-CM

## 2023-11-14 NOTE — Patient Instructions (Signed)

## 2023-11-14 NOTE — Progress Notes (Signed)
 Virtual Visit via Video Note  I connected with Peggy Lewis on 11/14/23 at  2:20 PM EDT by a video enabled telemedicine application and verified that I am speaking with the correct person using two identifiers.  Location: Patient: In her car Provider: Office  Person's participating in this video call: Angeline Laura, NP-C and Peggy Lewis   I discussed the limitations of evaluation and management by telemedicine and the availability of in person appointments. The patient expressed understanding and agreed to proceed.  History of Present Illness:   Discussed the use of AI scribe software for clinical note transcription with the patient, who gave verbal consent to proceed.  Peggy Lewis is a 33 year old female who presents for FMLA paperwork related to her medical appointments.  She is currently managing iron  deficiency anemia and has been receiving iron  infusions weekly until last week, with each session lasting approximately one to one and a half hours. She is scheduled to see her hematologist on August 20th.  She is experiencing severe constipation and is scheduled to see a gastroenterologist on August 4th; she has not yet seen this doctor and is unsure of how the appointment will proceed. She has not yet seen this doctor and is unsure of how the appointment will proceed.  She has additional appointments with a dermatologist on August 26th for a follow-up, and with her suboxone  doctor on August 8th in [place].    Past Medical History:  Diagnosis Date   Alcohol abuse    Anxiety    Depression    Heroin abuse (HCC)    Seizures (HCC)    Stroke (HCC)     Current Outpatient Medications  Medication Sig Dispense Refill   amphetamine -dextroamphetamine  (ADDERALL XR) 15 MG 24 hr capsule Take 1 capsule by mouth 2 (two) times daily. 60 capsule 0   amphetamine -dextroamphetamine  (ADDERALL) 5 MG tablet Take 1 tablet (5 mg total) by mouth daily. 30 tablet 0   buprenorphine -naloxone   (SUBOXONE ) 8-2 mg SUBL SL tablet PLACE ONE TABLET UNDER THE TONGUE EVERY MORNING, PLACE ONE-HALF TABLET UNDER THE TONGUE EVERY AFTERNOON, AND PLACE ONE TABLET UNDER THE TONGUE EVERY EVENING70 70 tablet 3   cyanocobalamin  (VITAMIN B12) 1000 MCG/ML injection Inject 1 mL (1,000 mcg total) into the muscle every 30 (thirty) days. 4 mL 1   doxycycline (MONODOX) 50 MG capsule Take 50 mg by mouth 2 (two) times daily.     escitalopram  (LEXAPRO ) 10 MG tablet Take 1 tablet (10 mg total) by mouth daily. 90 tablet 1   escitalopram  (LEXAPRO ) 20 MG tablet Take 1 tablet (20 mg total) by mouth daily. 90 tablet 1   famotidine  (PEPCID ) 10 MG tablet Take 1 tablet (10 mg total) by mouth daily. 90 tablet 1   SYRINGE-NEEDLE, DISP, 3 ML (BD SAFETYGLIDE SYRINGE/NEEDLE) 25G X 1 3 ML MISC Use the needle for M injection once a month. 30 each 1   No current facility-administered medications for this visit.    Allergies  Allergen Reactions   Aspirin Other (See Comments)    GI discomfort   Acetaminophen  Rash and Other (See Comments)    Reaction:  GI upset     Family History  Problem Relation Age of Onset   Diabetes Mother    Thrombocytopenia Mother    Depression Father    Drug abuse Father    Heart attack Father    Heart failure Maternal Grandmother    Skin cancer Paternal Grandmother    Colon cancer Neg  Hx    Liver disease Neg Hx     Social History   Socioeconomic History   Marital status: Married    Spouse name: Brooklyn Alfredo   Number of children: 1   Years of education: Not on file   Highest education level: GED or equivalent  Occupational History   Occupation: [employer]  Tobacco Use   Smoking status: Former    Current packs/day: 0.00    Types: Cigarettes    Quit date: 2021    Years since quitting: 4.5   Smokeless tobacco: Never  Vaping Use   Vaping status: Every Day   Substances: Nicotine , Flavoring  Substance and Sexual Activity   Alcohol use: Not Currently    Comment: no alcohol x 42yrs    Drug use: Not Currently    Comment: history of heroin usage, no drugs x 3 yrs   Sexual activity: Yes    Partners: Female  Other Topics Concern   Not on file  Social History Narrative   Lives with wife & daughter.    Social Drivers of Health   Financial Resource Strain: Medium Risk (12/09/2022)   Overall Financial Resource Strain (CARDIA)    Difficulty of Paying Living Expenses: Somewhat hard  Food Insecurity: No Food Insecurity (09/26/2023)   Hunger Vital Sign    Worried About Running Out of Food in the Last Year: Never true    Ran Out of Food in the Last Year: Never true  Transportation Needs: No Transportation Needs (09/18/2023)   PRAPARE - Administrator, Civil Service (Medical): No    Lack of Transportation (Non-Medical): No  Physical Activity: Sufficiently Active (12/09/2022)   Exercise Vital Sign    Days of Exercise per Week: 6 days    Minutes of Exercise per Session: 150+ min  Stress: Stress Concern Present (12/09/2022)   [employer] of Occupational Health - Occupational Stress Questionnaire    Feeling of Stress : Rather much  Social Connections: Unknown (12/09/2022)   Social Connection and Isolation Panel    Frequency of Communication with Friends and Family: More than three times a week    Frequency of Social Gatherings with Friends and Family: More than three times a week    Attends Religious Services: Patient declined    Database administrator or Organizations: No    Attends Engineer, structural: Not on file    Marital Status: Married  Catering manager Violence: Not At Risk (09/26/2023)   Humiliation, Afraid, Rape, and Kick questionnaire    Fear of Current or Ex-Partner: No    Emotionally Abused: No    Physically Abused: No    Sexually Abused: No     Constitutional: Denies fever, malaise, fatigue, headache or abrupt weight changes.  HEENT: Denies eye pain, eye redness, ear pain, ringing in the ears, wax buildup, runny nose, nasal congestion,  bloody nose, or sore throat. Respiratory: Denies difficulty breathing, shortness of breath, cough or sputum production.   Cardiovascular: Denies chest pain, chest tightness, palpitations or swelling in the hands or feet.  Gastrointestinal: Patient reports chronic constipation.  Denies abdominal pain, bloating, diarrhea or blood in the stool.  GU: Denies urgency, frequency, pain with urination, burning sensation, blood in urine, odor or discharge. Musculoskeletal: Denies decrease in range of motion, difficulty with gait, muscle pain or joint pain and swelling.  Skin: Denies redness, rashes, lesions or ulcercations.  Neurological: Patient reports inattention.  Denies dizziness, difficulty with memory, difficulty with speech or problems  with balance and coordination.  Psych: Patient has a history of anxiety and depression.  Denies SI/HI.  No other specific complaints in a complete review of systems (except as listed in HPI above).  Observations/Objective:  LMP 10/12/2023 Comment: pt denies any chances of pregnancy Wt Readings from Last 3 Encounters:  09/26/23 149 lb 11.2 oz (67.9 kg)  09/19/23 147 lb 6.4 oz (66.9 kg)  09/18/23 152 lb 9.6 oz (69.2 kg)    General: Appears her stated age, well developed, well nourished in NAD. Pulmonary/Chest: Normal effort. No respiratory distress. Neurological: Alert and oriented.   BMET    Component Value Date/Time   NA 136 09/18/2023 1058   NA 142 11/09/2013 0119   K 3.2 (L) 09/18/2023 1058   K 2.9 (L) 11/09/2013 0119   CL 101 09/18/2023 1058   CL 107 11/09/2013 0119   CO2 27 09/18/2023 1058   CO2 22 11/09/2013 0119   GLUCOSE 89 09/18/2023 1058   GLUCOSE 78 11/09/2013 0119   BUN 11 09/18/2023 1058   BUN 6 (L) 11/09/2013 0119   CREATININE 0.72 09/18/2023 1058   CREATININE 0.73 06/02/2022 1326   CALCIUM 8.3 (L) 09/18/2023 1058   CALCIUM 8.7 11/09/2013 0119   GFRNONAA >60 09/18/2023 1058   GFRNONAA >60 11/09/2013 0119   GFRAA >60 10/17/2014  0523   GFRAA >60 11/09/2013 0119    Lipid Panel     Component Value Date/Time   CHOL 186 06/02/2022 1326   TRIG 67 06/02/2022 1326   HDL 66 06/02/2022 1326   CHOLHDL 2.8 06/02/2022 1326   LDLCALC 105 (H) 06/02/2022 1326    CBC    Component Value Date/Time   WBC 3.8 (L) 10/10/2023 1304   WBC 3.9 (L) 09/18/2023 1657   RBC 3.65 (L) 10/10/2023 1304   HGB 8.7 (L) 10/10/2023 1304   HGB 10.4 (L) 11/09/2013 1259   HCT 28.9 (L) 10/10/2023 1304   HCT 44.4 11/09/2013 0119   PLT 393 10/10/2023 1304   PLT 390 11/09/2013 0119   MCV 79.2 (L) 10/10/2023 1304   MCV 90 11/09/2013 0119   MCH 23.8 (L) 10/10/2023 1304   MCHC 30.1 10/10/2023 1304   RDW 17.1 (H) 10/10/2023 1304   RDW 13.6 11/09/2013 0119   LYMPHSABS 2.9 10/16/2014 1050   LYMPHSABS 2.2 04/06/2013 1943   MONOABS 0.5 10/16/2014 1050   MONOABS 0.4 04/06/2013 1943   EOSABS 0.5 10/16/2014 1050   EOSABS 0.4 04/06/2013 1943   BASOSABS 0.1 10/16/2014 1050   BASOSABS 0.0 04/06/2013 1943    Hgb A1C No results found for: HGBA1C     Assessment and Plan:  Assessment and Plan    Chronic constipation Severe chronic constipation with potential gastrointestinal bleeding. - Follow-up with gastroenterologist on August 4th. - Allow 4 hours per appointment, 4 times a month, for FMLA coverage.  Iron  deficiency anemia Iron  deficiency anemia requiring regular hematology follow-up and iron  infusions. - Continue follow-up with Dr. Melanee, hematologist, on August 20th. - Allow 4 hours per appointment, 4 times a month, for FMLA coverage.       Schedule an appointment for your annual exam  Follow Up Instructions:    I discussed the assessment and treatment plan with the patient. The patient was provided an opportunity to ask questions and all were answered. The patient agreed with the plan and demonstrated an understanding of the instructions.   The patient was advised to call back or seek an in-person evaluation if the symptoms  worsen or  if the condition fails to improve as anticipated.   Angeline Laura, NP

## 2023-11-16 ENCOUNTER — Ambulatory Visit: Admitting: Internal Medicine

## 2023-11-17 ENCOUNTER — Other Ambulatory Visit: Payer: Self-pay | Admitting: Medical Genetics

## 2023-11-20 NOTE — Progress Notes (Addendum)
 PATIENT PROFILE: Jillane Po is a 33 y.o. female who presents to the [facility] GI department for consultation at the request of Dr. Antonette for evaluation of chronic cosntipation.  HISTORY OF PRESENT ILLNESS: Ms. Silvernail reports reports she was hospitalized early in June for IDA- says her hgb was 6 and she had a blood transfusion and has been having iron  infusions with Dr Melanee- last hgb 8.7  (10/10/23)with normal platelets. Liver enzymes and kidney function have been normal. She is also taking b12 supplementation Reports she is on suboxone  for hx opiod dependence has been opiod free x 6y. States she has bad constipation for years- using prn stool softeners and ex lax helps, but is not taking any regular stool agents- has been up to month in between bowel movements- has seen some brpr in past associated with difficult bms. Has some chronic abdominal pain/bloatedness/gassiness. States defecation is incomplete. Thalia- was problematic some years ago but not now. Was taking several goodys powders at one point but denies nsaids now.  Denies dysphagia, melena, acholic stools and further gi concerns. 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 She had no evidence of HBV. Lmp 11/13/23- a gyn eval is under consideration if gi eval negative. No known problems with sedated procedures Paternal uncle with colon cancer in 4s. No history of luminal evaluation  GENERAL REVIEW OF SYSTEMS:   10 systems reviewed, unremarkable other than what is written above.   MEDICATIONS: Outpatient Encounter Medications as of 11/20/2023  Medication Sig Dispense Refill  . azelastine (ASTELIN) 137 mcg nasal spray Place 1 spray into both nostrils 2 (two) times daily 10 mL 1  . buprenorphine -naloxone  (SUBOXONE ) 8-2 mg SL film Place under the tongue once daily    .  buPROPion (WELLBUTRIN XL) 300 MG XL tablet Take 300 mg by mouth once daily    . dextroamphetamine -amphetamine  (ADDERALL XR) 15 MG XR capsule Take 1 capsule by mouth 2 (two) times daily    . doxycycline (VIBRAMYCIN) 50 MG capsule as directed    . escitalopram  oxalate (LEXAPRO ) 10 MG tablet Take 1 tablet by mouth once daily    . famotidine  (PEPCID ) 10 MG tablet Take 1 tablet by mouth once daily    . ibuprofen  (ADVIL ,MOTRIN ) 800 MG tablet Take 1 tablet (800 mg total) by mouth every 8 (eight) hours as needed for Pain. 60 tablet 1  . metroNIDAZOLE (METROCREAM) 0.75 % cream APPLY TOPICALLY TWO (2) TIMES A DAY. TO THE FACE    . predniSONE  (DELTASONE ) 10 mg tablet pack 6 day taper. Take as directed with food 21 tablet 0   No facility-administered encounter medications on file as of 11/20/2023.    ALLERGIES: Acetaminophen  and Aspirin  PAST MEDICAL HISTORY: Past Medical History:  Diagnosis Date  . Heart disease   . Hepatitis   . Seizures (CMS/HHS-HCC)   . Stroke (CMS/HHS-HCC)     PAST SURGICAL HISTORY: Past Surgical History:  Procedure Laterality Date  . FRACTURE SURGERY    . TONSILLECTOMY       FAMILY HISTORY: Family History  Problem Relation Name Age of Onset  . Diabetes Mother       SOCIAL HISTORY: Social History   Socioeconomic History  . Marital status: Single  Tobacco Use  . Smoking status: Former    Current packs/day: 0.50    Types: Cigarettes  Passive exposure: Past  . Smokeless tobacco: Never  Vaping Use  . Vaping status: Every Day  Substance and Sexual Activity  . Alcohol use: No  . Drug use: No  . Sexual activity: Yes    Partners: Female   Social Drivers of Health   Financial Resource Strain: Medium Risk (12/09/2022)   Received from [facility]   Overall Financial Resource Strain (CARDIA)   . Difficulty of Paying Living Expenses: Somewhat hard  Food Insecurity: No Food Insecurity (09/26/2023)   Received from [facility]   Hunger Vital Sign   . Within  the past 12 months, you worried that your food would run out before you got the money to buy more.: Never true   . Within the past 12 months, the food you bought just didn't last and you didn't have money to get more.: Never true  Transportation Needs: No Transportation Needs (09/18/2023)   Received from [facility] - Transportation   . Lack of Transportation (Medical): No   . Lack of Transportation (Non-Medical): No    PHYSICAL EXAM: Vitals:   11/20/23 1429  BP: 97/64  Pulse: 76  Temp: 36.9 C (98.5 F)   Body mass index is 26.81 kg/m. Weight: 66.5 kg (146 lb 9.6 oz)   General Appearance:    Alert, cooperative, no distress, appears stated age  Head:     Atraumatic, normocephalic  Eyes:   Anciteric  Neck:   Supple, symmetrical, trachea midline, no adenopathy, no thyroid  enlargement; no JVD  Throat:   Lips, mucosa, and tongue normal; teeth and gums normal  Lungs:     Clear to auscultation bilaterally, respirations unlabored   Heart:    Regular rate and rhythm, S1 and S2 normal, no murmur, rub   or gallop  Abdomen:     Soft, non-tender, bowel sounds active all four quadrants,    no masses, no organomegaly  Extremities:   Extremities normal, atraumatic, no cyanosis or edema  Skin:   Skin color, texture, turgor normal, no rashes or lesions   Neurologic:   Grossly intact    REVIEW OF DATA: I have reviewed the following data today: Labs notes [facility]monc & hospital notes  ASSESSMENT AND PLAN: Ms. Huffstetler is a 33 y.o. female presenting for consultation for IDA and chronic constipation. Schedule egd/colonoscopy for luminal evaluation.  Procedure information given: indications, benefits, risks- including, but not limited to bleeding, infection, perforation, difficulty with sedation, were discussed with the patient, and they are agreeable to the procedure. For her chronic constipation - last bm 4d ago- will do half miralax bowel prep tonight, can repeat in am then start daily dose of  miralax. Will also check for celiac and check HBT as she has other sx of sibo and risk factors.  She is to call at the end of the week and update on bowel habits- will decide bowel prep at that time. Follow up in a few days by phone, sooner if problems     Addendum- celiac panel inconclusive

## 2023-11-23 NOTE — Progress Notes (Deleted)
 Patient name: Peggy Lewis Date of birth: August 09, 1990 Date of visit: 11/23/23  Subjective   Peggy Lewis is here for a periodic preventive medicine exam.  No new or worsening symptoms. Review of general health and wellness concerns, lifestyle, and risk factors discussed. Allergies  Allergen Reactions   Aspirin Other (See Comments)    GI discomfort   Acetaminophen  Rash and Other (See Comments)    Reaction:  GI upset    Current Outpatient Medications  Medication Instructions   amphetamine -dextroamphetamine  (ADDERALL XR) 15 MG 24 hr capsule 1 capsule, Oral, 2 times daily   amphetamine -dextroamphetamine  (ADDERALL) 5 MG tablet 5 mg, Oral, Daily   buprenorphine -naloxone  (SUBOXONE ) 8-2 mg SUBL SL tablet PLACE ONE TABLET UNDER THE TONGUE EVERY MORNING, PLACE ONE-HALF TABLET UNDER THE TONGUE EVERY AFTERNOON, AND PLACE ONE TABLET UNDER THE TONGUE EVERY EVENING70   cyanocobalamin  (VITAMIN B12) 1,000 mcg, Intramuscular, Every 30 days   doxycycline (MONODOX) 50 mg, 2 times daily   escitalopram  (LEXAPRO ) 10 mg, Oral, Daily   escitalopram  (LEXAPRO ) 20 mg, Oral, Daily   famotidine  (PEPCID ) 10 mg, Oral, Daily   SYRINGE-NEEDLE, DISP, 3 ML (BD SAFETYGLIDE SYRINGE/NEEDLE) 25G X 1 3 ML MISC Use the needle for M injection once a month.   Past Medical History:  Diagnosis Date   Alcohol abuse    Anxiety    Depression    Heroin abuse (HCC)    Seizures (HCC)    Stroke (HCC)    Past Surgical History:  Procedure Laterality Date   ADENOIDECTOMY     WRIST SURGERY     Family History  Problem Relation Age of Onset   Diabetes Mother    Thrombocytopenia Mother    Depression Father    Drug abuse Father    Heart attack Father    Heart failure Maternal Grandmother    Skin cancer Paternal Grandmother    Colon cancer Neg Hx    Liver disease Neg Hx    Social History   Socioeconomic History   Marital status: Married    Spouse name: Linah Klapper   Number of children: 1   Years of education: Not on  file   Highest education level: GED or equivalent  Occupational History   Occupation: [employer]  Tobacco Use   Smoking status: Former    Current packs/day: 0.00    Types: Cigarettes    Quit date: 2021    Years since quitting: 4.6   Smokeless tobacco: Never  Vaping Use   Vaping status: Every Day   Substances: Nicotine , Flavoring  Substance and Sexual Activity   Alcohol use: Not Currently    Comment: no alcohol x 25yrs   Drug use: Not Currently    Comment: history of heroin usage, no drugs x 3 yrs   Sexual activity: Yes    Partners: Female  Other Topics Concern   Not on file  Social History Narrative   Lives with wife & daughter.    Social Drivers of Corporate investment banker Strain: Low Risk  (11/20/2023)   Received from [facility]   Overall Financial Resource Strain (CARDIA)    Difficulty of Paying Living Expenses: Not hard at all  Food Insecurity: No Food Insecurity (11/20/2023)   [facility]   Hunger Vital Sign    Within the past 12 months, you worried that your food would run out before you got the money to buy more.: Never true    Within the  past 12 months, the food you bought just didn't last and you didn't have money to get more.: Never true  Transportation Needs: No Transportation Needs (11/20/2023)   Received from [facility] - Transportation    In the past 12 months, has lack of transportation kept you from medical appointments or from getting medications?: No    Lack of Transportation (Non-Medical): No  Physical Activity: Sufficiently Active (12/09/2022)   Exercise Vital Sign    Days of Exercise per Week: 6 days    Minutes of Exercise per Session: 150+ min  Stress: Stress Concern Present (12/09/2022)   [employer] of Occupational Health - Occupational Stress Questionnaire    Feeling of Stress : Rather much  Social Connections: Unknown (12/09/2022)   Social Connection and Isolation  Panel    Frequency of Communication with Friends and Family: More than three times a week    Frequency of Social Gatherings with Friends and Family: More than three times a week    Attends Religious Services: Patient declined    Database administrator or Organizations: No    Attends Engineer, structural: Not on file    Marital Status: Married  Catering manager Violence: Not At Risk (09/26/2023)   Humiliation, Afraid, Rape, and Kick questionnaire    Fear of Current or Ex-Partner: No    Emotionally Abused: No    Physically Abused: No    Sexually Abused: No    Review of Systems  Constitutional:  Negative for fever, malaise/fatigue and weight loss.  HENT:  Negative for hearing loss.   Eyes:        No changes to vision  Respiratory:  Negative for cough and shortness of breath.   Cardiovascular:  Negative for chest pain and palpitations.  Gastrointestinal:  Negative for abdominal pain and blood in stool.       No changes to bowel habits  Genitourinary:  Negative for hematuria.       No changes to urinary habits  Musculoskeletal:  Negative for falls.  Psychiatric/Behavioral:  Negative for depression. The patient does not have insomnia.      Objective  There were no vitals filed for this visit.There is no height or weight on file to calculate BMI.   Physical Exam Constitutional:      General: She is not in acute distress.    Appearance: Normal appearance.  HENT:     Right Ear: Tympanic membrane, ear canal and external ear normal.     Left Ear: Tympanic membrane, ear canal and external ear normal.     Mouth/Throat:     Mouth: Mucous membranes are moist.     Pharynx: No oropharyngeal exudate or posterior oropharyngeal erythema.  Eyes:     Extraocular Movements: Extraocular movements intact.     Conjunctiva/sclera: Conjunctivae normal.     Pupils: Pupils are equal, round, and reactive to light.  Neck:     Thyroid : No thyroid  mass, thyromegaly or thyroid  tenderness.      Vascular: No carotid bruit.  Cardiovascular:     Rate and Rhythm: Normal rate and regular rhythm.     Pulses: Normal pulses.     Heart sounds: No murmur heard. Pulmonary:     Effort: Pulmonary effort is normal.     Breath sounds: Normal breath sounds. No wheezing or rales.  Abdominal:     Palpations: Abdomen is soft. There is no hepatomegaly, splenomegaly or mass.     Tenderness: There is  no abdominal tenderness.  Musculoskeletal:     Right lower leg: No edema.     Left lower leg: No edema.  Lymphadenopathy:     Cervical: No cervical adenopathy.  Skin:    General: Skin is warm and dry.  Neurological:     Mental Status: She is alert. Mental status is at baseline.     Cranial Nerves: No facial asymmetry.     Motor: No tremor.     Deep Tendon Reflexes: Reflexes normal.  Psychiatric:        Mood and Affect: Mood normal.        Behavior: Behavior normal.       Health Maintenance  Topic Date Due   DTaP/Tdap/Td (1 - Tdap) Never done   Hepatitis B Vaccines (1 of 3 - 19+ 3-dose series) Never done   HPV VACCINES (1 - Risk 3-dose SCDM series) Never done   COVID-19 Vaccine (3 - Pfizer risk series) 09/24/2019   INFLUENZA VACCINE  11/17/2023   Cervical Cancer Screening (HPV/Pap Cotest)  06/04/2024   Hepatitis C Screening  Completed   HIV Screening  Completed   Meningococcal B Vaccine  Aged Out   Assessment & Plan   There are no diagnoses linked to this encounter.  No follow-ups on file.  Ozell Kung MD 11/23/2023, 11:57 AM

## 2023-11-24 ENCOUNTER — Other Ambulatory Visit: Payer: Self-pay

## 2023-11-24 ENCOUNTER — Ambulatory Visit

## 2023-11-24 ENCOUNTER — Ambulatory Visit: Payer: Self-pay | Admitting: Student

## 2023-11-24 VITALS — BP 111/72 | HR 81 | Temp 98.4°F | Ht 63.0 in | Wt 144.4 lb

## 2023-11-24 DIAGNOSIS — F1121 Opioid dependence, in remission: Secondary | ICD-10-CM

## 2023-11-24 DIAGNOSIS — F419 Anxiety disorder, unspecified: Secondary | ICD-10-CM | POA: Diagnosis not present

## 2023-11-24 DIAGNOSIS — D509 Iron deficiency anemia, unspecified: Secondary | ICD-10-CM

## 2023-11-24 DIAGNOSIS — F32A Depression, unspecified: Secondary | ICD-10-CM

## 2023-11-24 DIAGNOSIS — F909 Attention-deficit hyperactivity disorder, unspecified type: Secondary | ICD-10-CM | POA: Diagnosis not present

## 2023-11-24 MED ORDER — AMPHETAMINE-DEXTROAMPHETAMINE 5 MG PO TABS: 5.0000 mg | ORAL_TABLET | Freq: Every day | ORAL | 0 refills | Status: DC

## 2023-11-24 MED ORDER — BUPRENORPHINE HCL-NALOXONE HCL 8-2 MG SL SUBL: SUBLINGUAL_TABLET | SUBLINGUAL | 3 refills | Status: DC

## 2023-11-24 MED ORDER — ESCITALOPRAM OXALATE 20 MG PO TABS: 20.0000 mg | ORAL_TABLET | Freq: Every day | ORAL | 1 refills | Status: DC

## 2023-11-24 MED ORDER — AMPHETAMINE-DEXTROAMPHET ER 15 MG PO CP24
15.0000 mg | ORAL_CAPSULE | Freq: Two times a day (BID) | ORAL | 0 refills | Status: DC
Start: 2023-11-24 — End: 2024-01-02

## 2023-11-24 MED ORDER — ESCITALOPRAM OXALATE 10 MG PO TABS: 10.0000 mg | ORAL_TABLET | Freq: Every day | ORAL | 1 refills | Status: DC

## 2023-11-24 NOTE — Assessment & Plan Note (Signed)
 Patient Is currently on suboxone . She takes 8-2 mg 1 in the morning, half in the afternoon and 1 in the evening.  She reports that she does not have any cravings, however she does sometimes get shaky and sweaty before her second dose if she is at work and moving around a lot.  She normally takes this medication at 7 AM, 4 PM, then 10 PM.  She was advised to decrease the amount of time between her 1st and 2nd doses if possible.  Advised to move her afternoon dose a little earlier around 2 PM.  She also inquired about taking 1/2 pill in the morning and then another 1/2 around 10 AM.  I think this is reasonable if moving her afternoon dose earlier does not work for her.  Her tox assure has been reassuring for the past year.   Plan: -Continue suboxone  8-2 mg 1 in the morning, half in the afternoon around 2 PM and 1 in the evening.  Refill provided -ToxAssure today -return in 3 months

## 2023-11-24 NOTE — Assessment & Plan Note (Signed)
 Current regimen Adderall 15 mg twice daily with an extra 5 mg daily as needed with the second dose in the evening.  She reports no side effects of this medication and believes that her ADD is well-controlled at this time.  If she would require more than this medication in the future, we would need to refer her to a psychiatrist.  -Continue Adderall 15 mg XR BID and Adderall IR 5 mg daily prn with 2nd 15 mg dose, refills provided -Return in 3 months   -If patient is requiring more dose adjustment it will be out of scope of practice for the [facility] and would need to refer to a psychiatrist

## 2023-11-24 NOTE — Patient Instructions (Addendum)
 Thank you, [name], for allowing us  to provide your care today. Today we discussed . . .  > Suboxone        - It sounds like your current regimen of 1 tablet, half tablet, 1 tablet is working pretty well for you although not perfect.  We discussed decreasing the amount of time between your 1st and 2nd dose either by taking your first tablet a little later or taking your half tablet earlier.  I think if you continue to feel shaky before you take the half tablet we began earlier as a good idea.  Will give you refill the Suboxone . > Anemia       - Keep your follow-up for the colonoscopy on the 14th. We will let the oncologists order what labs they need at your next appointment. > ADD       - We will continue your Adderall 15 mg twice a day and 5 mg once a day >Depression  - Will refill your Lexapro    I have ordered the following labs for you:   Lab Orders         ToxAssure Select,+Antidepr,UR       Referrals ordered today:   Referral Orders  No referral(s) requested today      Follow up: 3 months    Remember:  Should you have any questions or concerns please call the internal medicine clinic at [phone].     Melvenia Morrison, [facility]

## 2023-11-24 NOTE — Progress Notes (Signed)
 CC: Routine Follow Up for ADD and OUD after last office visit 5/12  HPI:  Peggy Lewis is a 33 y.o. female with pertinent PMH of OUD in remission on maintenace therapy, ADD, anxiety, depression, and IDA who presents for follow-up for OUD maintenance therapy.  Please see problem-based assessment plan for further details.    Medications: Current Outpatient Medications  Medication Instructions   amphetamine -dextroamphetamine  (ADDERALL XR) 15 MG 24 hr capsule 1 capsule, Oral, 2 times daily   amphetamine -dextroamphetamine  (ADDERALL) 5 MG tablet 5 mg, Oral, Daily   buprenorphine -naloxone  (SUBOXONE ) 8-2 mg SUBL SL tablet PLACE ONE TABLET UNDER THE TONGUE EVERY MORNING, PLACE ONE-HALF TABLET UNDER THE TONGUE EVERY AFTERNOON, AND PLACE ONE TABLET UNDER THE TONGUE EVERY EVENING70   cyanocobalamin  (VITAMIN B12) 1,000 mcg, Intramuscular, Every 30 days   doxycycline (MONODOX) 50 mg, 2 times daily   escitalopram  (LEXAPRO ) 10 mg, Oral, Daily   escitalopram  (LEXAPRO ) 20 mg, Oral, Daily   famotidine  (PEPCID ) 10 mg, Oral, Daily   SYRINGE-NEEDLE, DISP, 3 ML (BD SAFETYGLIDE SYRINGE/NEEDLE) 25G X 1 3 ML MISC Use the needle for M injection once a month.     Physical Exam:  Vitals:   11/24/23 0944  BP: 111/72  Pulse: 81  Temp: 98.4 F (36.9 C)  TempSrc: Oral  SpO2: 97%  Weight: 144 lb 6.4 oz (65.5 kg)  Height: 5' 3 (1.6 m)    Physical Exam Constitutional:      General: She is not in acute distress.    Appearance: She is not ill-appearing.  Eyes:     Pupils: Pupils are equal, round, and reactive to light.     Comments: Mild conjuntival pallor  Cardiovascular:     Rate and Rhythm: Normal rate and regular rhythm.  Pulmonary:     Effort: No respiratory distress.     Breath sounds: Normal breath sounds.  Neurological:     Mental Status: She is alert.       Assessment & Plan:   Assessment & Plan Opioid use disorder, moderate, in sustained remission, on maintenance therapy  [facility]) Patient Is currently on suboxone . She takes 8-2 mg 1 in the morning, half in the afternoon and 1 in the evening.  She reports that she does not have any cravings, however she does sometimes get shaky and sweaty before her second dose if she is at work and moving around a lot.  She normally takes this medication at 7 AM, 4 PM, then 10 PM.  She was advised to decrease the amount of time between her 1st and 2nd doses if possible.  Advised to move her afternoon dose a little earlier around 2 PM.  She also inquired about taking 1/2 pill in the morning and then another 1/2 around 10 AM.  I think this is reasonable if moving her afternoon dose earlier does not work for her.  Her tox assure has been reassuring for the past year.   Plan: -Continue suboxone  8-2 mg 1 in the morning, half in the afternoon around 2 PM and 1 in the evening.  Refill provided -ToxAssure today -return in 3 months Attention deficit hyperactivity disorder (ADHD), unspecified ADHD type Current regimen Adderall 15 mg twice daily with an extra 5 mg daily as needed with the second dose in the evening.  She reports no side effects of this medication and believes that her ADD is well-controlled at this time.  If she would require more than this medication in the future, we would need  to refer her to a psychiatrist.  -Continue Adderall 15 mg XR BID and Adderall IR 5 mg daily prn with 2nd 15 mg dose, refills provided -Return in 3 months   -If patient is requiring more dose adjustment it will be out of scope of practice for the [facility] and would need to refer to a psychiatrist  Anxiety and depression She has no concerns today.  She states that her anxiety and depression are well-controlled with 30 mg of Lexapro .  Continue Lexapro  30, refilled today Iron  deficiency anemia, unspecified iron  deficiency anemia type Hospitalized in June of this year for symptomatic anemia requiring transfusion.  She is seeing oncology for iron  transfusions and  this is also being managed by Angeline Labrum, NP.  She is scheduled for colonoscopy on 14 August.  Orders Placed This Encounter  Procedures   ToxAssure Select,+Antidepr,UR    Patient seen with Dr. Ronnald Karna Melvenia Napoleon, MD [facility] Center Internal Medicine Resident PGY-1 Clinic Phone: [phone] Please contact the on call pager at [phone] for any urgent or emergent needs.

## 2023-11-24 NOTE — Assessment & Plan Note (Signed)
 Hospitalized in June of this year for symptomatic anemia requiring transfusion.  She is seeing oncology for iron  transfusions and this is also being managed by Angeline Labrum, NP.  She is scheduled for colonoscopy on 14 August.

## 2023-11-24 NOTE — Assessment & Plan Note (Signed)
 She has no concerns today.  She states that her anxiety and depression are well-controlled with 30 mg of Lexapro .  Continue Lexapro  30, refilled today

## 2023-11-27 NOTE — Addendum Note (Signed)
 Addended by: KARNA FELLOWS on: 11/27/2023 11:50 AM   Modules accepted: Level of Service

## 2023-11-27 NOTE — Progress Notes (Signed)
 Internal Medicine Clinic Attending  I was physically present during the key portions of the resident provided service and participated in the medical decision making of patient's management care. I reviewed pertinent patient test results.  The assessment, diagnosis, and plan were formulated together and I agree with the documentation in the resident's note.  Dickie La, MD

## 2023-11-28 ENCOUNTER — Ambulatory Visit: Payer: Self-pay

## 2023-11-28 LAB — MED LIST ATTACHED SEPARATELY

## 2023-11-28 LAB — TOXASSURE SELECT,+ANTIDEPR,UR

## 2023-12-06 ENCOUNTER — Inpatient Hospital Stay: Attending: Oncology

## 2023-12-06 ENCOUNTER — Other Ambulatory Visit

## 2023-12-06 ENCOUNTER — Other Ambulatory Visit
Admission: RE | Admit: 2023-12-06 | Discharge: 2023-12-06 | Disposition: A | Discharge status: Home / Self Care | Payer: Self-pay | Source: Ambulatory Visit | Attending: Medical Genetics | Admitting: Medical Genetics

## 2023-12-06 ENCOUNTER — Encounter: Payer: Self-pay | Admitting: Oncology

## 2023-12-06 ENCOUNTER — Inpatient Hospital Stay (HOSPITAL_BASED_OUTPATIENT_CLINIC_OR_DEPARTMENT_OTHER): Admitting: Oncology

## 2023-12-06 VITALS — HR 93 | Temp 98.0°F | Resp 18 | Ht 63.0 in | Wt 144.9 lb

## 2023-12-06 DIAGNOSIS — D508 Other iron deficiency anemias: Secondary | ICD-10-CM

## 2023-12-06 DIAGNOSIS — D509 Iron deficiency anemia, unspecified: Secondary | ICD-10-CM | POA: Insufficient documentation

## 2023-12-06 LAB — CBC (CANCER CENTER ONLY)
HCT: 36.9 % (ref 36.0–46.0)
Hemoglobin: 11.9 g/dL — ABNORMAL LOW (ref 12.0–15.0)
MCH: 26.7 pg (ref 26.0–34.0)
MCHC: 32.2 g/dL (ref 30.0–36.0)
MCV: 82.9 fL (ref 80.0–100.0)
Platelet Count: 373 K/uL (ref 150–400)
RBC: 4.45 MIL/uL (ref 3.87–5.11)
RDW: 16.3 % — ABNORMAL HIGH (ref 11.5–15.5)
WBC Count: 4.9 K/uL (ref 4.0–10.5)
nRBC: 0 % (ref 0.0–0.2)

## 2023-12-06 LAB — IRON AND TIBC
Iron: 62 ug/dL (ref 28–170)
Saturation Ratios: 16 % (ref 10.4–31.8)
TIBC: 389 ug/dL (ref 250–450)
UIBC: 327 ug/dL

## 2023-12-06 LAB — FERRITIN: Ferritin: 32 ng/mL (ref 11–307)

## 2023-12-07 ENCOUNTER — Encounter: Payer: Self-pay | Admitting: Oncology

## 2023-12-07 NOTE — Progress Notes (Signed)
 Hematology/Oncology Consult note [facility]  [phone] [phone]  Patient Care Team: Renne Homans, MD as PCP - General (Internal Medicine) Rosan Dayton BROCKS, DO as Consulting Physician (Addiction Medicine) Melanee Annah BROCKS, MD as Consulting Physician (Oncology)   Name of the patient: Peggy Lewis  [id number]  03-14-1991   Date of visit: 12/07/23  Diagnosis-iron  deficiency anemia  Chief complaint/ Reason for visit-routine follow-up of iron  deficiency anemia  Heme/Onc history: patient is a 33 year old female with a past medical history significant for hyperlipidemia, GERD, anxiety and depressionWho has been referred for iron  deficiency anemia.  Labs from 09/18/2023 showed H&H of 6.4/21.4 with an MCV of 80.1.  Platelets mildly elevated at 410 and white count normal at 4.  Ferritin levels were less than 3 and they were 3 a year ago.  Iron  saturation was low at 2% with elevated TIBC of 480.   She has been referred to GI by her primary care doctor but is yet to hear from them.  She reports taking BC powder once at night.  She has been on famotidine  as well.  Menstrual cycles last for about 4 days out of which the first 3 days have been heavy.  She has not seen GYN recently.  Denies any blood loss in her stool or urine.  Denies any dark melanotic stool.  Her uncle has been diagnosed with colon cancer and is presently going through treatments.  She is on Suboxone  for history of opioid use disorder and therefore has constipation secondary to it but has been trying to take oral iron   Interval history-patient felt better for a couple of weeks after receiving IV iron  but is back to feeling fatigued.  She is on monthly B12 injections.  ECOG PS- 0 Pain scale- 0   Review of systems- Review of Systems  Constitutional:  Positive for malaise/fatigue. Negative for chills, fever and weight loss.  HENT:  Negative for congestion, ear discharge and nosebleeds.    Eyes:  Negative for blurred vision.  Respiratory:  Negative for cough, hemoptysis, sputum production, shortness of breath and wheezing.   Cardiovascular:  Negative for chest pain, palpitations, orthopnea and claudication.  Gastrointestinal:  Negative for abdominal pain, blood in stool, constipation, diarrhea, heartburn, melena, nausea and vomiting.  Genitourinary:  Negative for dysuria, flank pain, frequency, hematuria and urgency.  Musculoskeletal:  Negative for back pain, joint pain and myalgias.  Skin:  Negative for rash.  Neurological:  Negative for dizziness, tingling, focal weakness, seizures, weakness and headaches.  Endo/Heme/Allergies:  Does not bruise/bleed easily.  Psychiatric/Behavioral:  Negative for depression and suicidal ideas. The patient does not have insomnia.       Allergies  Allergen Reactions   Aspirin Other (See Comments)    GI discomfort   Acetaminophen  Rash and Other (See Comments)    Reaction:  GI upset      Past Medical History:  Diagnosis Date   Alcohol abuse    Anxiety    Depression    Heroin abuse (HCC)    Seizures (HCC)    Stroke (HCC)      Past Surgical History:  Procedure Laterality Date   ADENOIDECTOMY     WRIST SURGERY      Social History   Socioeconomic History   Marital status: Married    Spouse name: Maysa Lynn   Number of children: 1   Years of education: Not on file   Highest education level: GED or equivalent  Occupational History  Occupation: [employer]  Tobacco Use   Smoking status: Former    Current packs/day: 0.00    Types: Cigarettes    Quit date: 2021    Years since quitting: 4.6   Smokeless tobacco: Never  Vaping Use   Vaping status: Every Day   Substances: Nicotine , Flavoring  Substance and Sexual Activity   Alcohol use: Not Currently    Comment: no alcohol x 62yrs   Drug use: Not Currently    Comment: history of heroin usage, no drugs x 3 yrs   Sexual activity: Yes    Partners: Female  Other Topics  Concern   Not on file  Social History Narrative   Lives with wife & daughter.    Social Drivers of Corporate investment banker Strain: Low Risk  (11/24/2023)   Overall Financial Resource Strain (CARDIA)    Difficulty of Paying Living Expenses: Not hard at all  Food Insecurity: No Food Insecurity (11/24/2023)   Hunger Vital Sign    Worried About Running Out of Food in the Last Year: Never true    Ran Out of Food in the Last Year: Never true  Transportation Needs: No Transportation Needs (11/24/2023)   PRAPARE - Administrator, Civil Service (Medical): No    Lack of Transportation (Non-Medical): No  Physical Activity: Sufficiently Active (11/24/2023)   Exercise Vital Sign    Days of Exercise per Week: 7 days    Minutes of Exercise per Session: 150+ min  Stress: No Stress Concern Present (11/24/2023)   [employer] of Occupational Health - Occupational Stress Questionnaire    Feeling of Stress: Not at all  Social Connections: Moderately Isolated (11/24/2023)   Social Connection and Isolation Panel    Frequency of Communication with Friends and Family: Once a week    Frequency of Social Gatherings with Friends and Family: More than three times a week    Attends Religious Services: Never    Database administrator or Organizations: No    Attends Engineer, structural: Not on file    Marital Status: Married  Catering manager Violence: Not At Risk (09/26/2023)   Humiliation, Afraid, Rape, and Kick questionnaire    Fear of Current or Ex-Partner: No    Emotionally Abused: No    Physically Abused: No    Sexually Abused: No    Family History  Problem Relation Age of Onset   Diabetes Mother    Thrombocytopenia Mother    Depression Father    Drug abuse Father    Heart attack Father    Heart failure Maternal Grandmother    Skin cancer Paternal Grandmother    Colon cancer Neg Hx    Liver disease Neg Hx      Current Outpatient Medications:     amphetamine -dextroamphetamine  (ADDERALL XR) 15 MG 24 hr capsule, Take 1 capsule by mouth 2 (two) times daily., Disp: 60 capsule, Rfl: 0   amphetamine -dextroamphetamine  (ADDERALL) 5 MG tablet, Take 1 tablet (5 mg total) by mouth daily., Disp: 30 tablet, Rfl: 0   buprenorphine -naloxone  (SUBOXONE ) 8-2 mg SUBL SL tablet, PLACE ONE TABLET UNDER THE TONGUE EVERY MORNING, PLACE ONE-HALF TABLET UNDER THE TONGUE EVERY AFTERNOON, AND PLACE ONE TABLET UNDER THE TONGUE EVERY EVENING70, Disp: 70 tablet, Rfl: 3   cyanocobalamin  (VITAMIN B12) 1000 MCG/ML injection, Inject 1 mL (1,000 mcg total) into the muscle every 30 (thirty) days., Disp: 4 mL, Rfl: 1   doxycycline (MONODOX) 50 MG capsule, Take 50 mg by  mouth 2 (two) times daily., Disp: , Rfl:    escitalopram  (LEXAPRO ) 10 MG tablet, Take 1 tablet (10 mg total) by mouth daily., Disp: 90 tablet, Rfl: 1   escitalopram  (LEXAPRO ) 20 MG tablet, Take 1 tablet (20 mg total) by mouth daily., Disp: 90 tablet, Rfl: 1   famotidine  (PEPCID ) 10 MG tablet, Take 1 tablet (10 mg total) by mouth daily., Disp: 90 tablet, Rfl: 1   SYRINGE-NEEDLE, DISP, 3 ML (BD SAFETYGLIDE SYRINGE/NEEDLE) 25G X 1 3 ML MISC, Use the needle for M injection once a month., Disp: 30 each, Rfl: 1  Physical exam:  Vitals:   12/06/23 1136  Pulse: 93  Resp: 18  Temp: 98 F (36.7 C)  TempSrc: Tympanic  SpO2: 100%  Weight: 144 lb 14.4 oz (65.7 kg)  Height: 5' 3 (1.6 m)   Physical Exam Cardiovascular:     Rate and Rhythm: Normal rate and regular rhythm.     Heart sounds: Normal heart sounds.  Pulmonary:     Effort: Pulmonary effort is normal.     Breath sounds: Normal breath sounds.  Skin:    General: Skin is warm and dry.  Neurological:     Mental Status: She is alert and oriented to person, place, and time.      I have personally reviewed labs listed below:    Latest Ref Rng & Units 09/18/2023   10:58 AM  CMP  Glucose 70 - 99 mg/dL 89   BUN 6 - 20 mg/dL 11   Creatinine 9.55 - 1.00  mg/dL 9.27   Sodium 864 - 854 mmol/L 136   Potassium 3.5 - 5.1 mmol/L 3.2   Chloride 98 - 111 mmol/L 101   CO2 22 - 32 mmol/L 27   Calcium 8.9 - 10.3 mg/dL 8.3       Latest Ref Rng & Units 12/06/2023   11:23 AM  CBC  WBC 4.0 - 10.5 K/uL 4.9   Hemoglobin 12.0 - 15.0 g/dL 88.0   Hematocrit 63.9 - 46.0 % 36.9   Platelets 150 - 400 K/uL 373     Assessment and plan- Patient is a 34 y.o. female here for routine follow-up of Iron  deficiency anemia  After receiving IV iron  patient's hemoglobin has improved from 8.9-11.9.  Ferritin levels are still low at 32 although improved from less than 3.  I think it would be reasonable to offer her 3 more doses of Venofer  at this time.  She will continue with monthly B12 injections.  CBC ferritin and iron  studies in 3 and 6 months and I will see her back in 6 months.  Patient is aware about possible infusion risks associated with IV iron  infusions but has tolerated Venofer  well recently   Visit Diagnosis 1. Other iron  deficiency anemia      Dr. Annah Skene, MD, MPH [facility] [phone] 12/07/2023 10:04 AM

## 2023-12-15 ENCOUNTER — Inpatient Hospital Stay

## 2023-12-15 VITALS — BP 102/67 | HR 84 | Temp 98.5°F | Resp 16

## 2023-12-15 DIAGNOSIS — D508 Other iron deficiency anemias: Secondary | ICD-10-CM

## 2023-12-15 DIAGNOSIS — D509 Iron deficiency anemia, unspecified: Secondary | ICD-10-CM | POA: Diagnosis not present

## 2023-12-15 LAB — GENECONNECT MOLECULAR SCREEN: Genetic Analysis Overall Interpretation: NEGATIVE

## 2023-12-15 MED ORDER — IRON SUCROSE 20 MG/ML IV SOLN
200.0000 mg | INTRAVENOUS | Status: DC
  Administered 2023-12-15: 200 mg via INTRAVENOUS
  Filled 2023-12-15: qty 10

## 2023-12-15 NOTE — Progress Notes (Signed)
 Declined post-observation. Aware of risks. Vitals stable at discharge.

## 2023-12-15 NOTE — Patient Instructions (Signed)

## 2023-12-22 ENCOUNTER — Inpatient Hospital Stay: Attending: Oncology

## 2023-12-22 VITALS — BP 108/64 | HR 85 | Temp 96.4°F | Resp 19

## 2023-12-22 DIAGNOSIS — D508 Other iron deficiency anemias: Secondary | ICD-10-CM

## 2023-12-22 DIAGNOSIS — D509 Iron deficiency anemia, unspecified: Secondary | ICD-10-CM | POA: Insufficient documentation

## 2023-12-22 MED ORDER — SODIUM CHLORIDE 0.9% FLUSH
10.0000 mL | Freq: Once | INTRAVENOUS | Status: AC | PRN
  Administered 2023-12-22: 10 mL
  Filled 2023-12-22: qty 10

## 2023-12-22 MED ORDER — IRON SUCROSE 20 MG/ML IV SOLN
200.0000 mg | INTRAVENOUS | Status: DC
  Administered 2023-12-22: 200 mg via INTRAVENOUS
  Filled 2023-12-22: qty 10

## 2023-12-28 ENCOUNTER — Inpatient Hospital Stay

## 2023-12-28 VITALS — BP 108/69 | HR 88 | Temp 97.6°F | Resp 18

## 2023-12-28 DIAGNOSIS — D509 Iron deficiency anemia, unspecified: Secondary | ICD-10-CM | POA: Diagnosis not present

## 2023-12-28 DIAGNOSIS — D508 Other iron deficiency anemias: Secondary | ICD-10-CM

## 2023-12-28 MED ORDER — IRON SUCROSE 20 MG/ML IV SOLN
200.0000 mg | INTRAVENOUS | Status: DC
  Administered 2023-12-28: 200 mg via INTRAVENOUS
  Filled 2023-12-28: qty 10

## 2023-12-28 MED ORDER — SODIUM CHLORIDE 0.9% FLUSH
10.0000 mL | Freq: Once | INTRAVENOUS | Status: AC | PRN
  Administered 2023-12-28: 10 mL
  Filled 2023-12-28: qty 10

## 2023-12-29 ENCOUNTER — Inpatient Hospital Stay

## 2024-01-02 ENCOUNTER — Other Ambulatory Visit: Payer: Self-pay | Admitting: Student

## 2024-01-02 DIAGNOSIS — F909 Attention-deficit hyperactivity disorder, unspecified type: Secondary | ICD-10-CM

## 2024-01-02 DIAGNOSIS — F419 Anxiety disorder, unspecified: Secondary | ICD-10-CM

## 2024-01-02 MED ORDER — ESCITALOPRAM OXALATE 20 MG PO TABS: 20.0000 mg | ORAL_TABLET | Freq: Every day | ORAL | 1 refills | Status: AC

## 2024-01-02 MED ORDER — ESCITALOPRAM OXALATE 10 MG PO TABS: 10.0000 mg | ORAL_TABLET | Freq: Every day | ORAL | 1 refills | Status: AC

## 2024-01-02 MED ORDER — AMPHETAMINE-DEXTROAMPHETAMINE 5 MG PO TABS: 5.0000 mg | ORAL_TABLET | Freq: Every day | ORAL | 0 refills | Status: DC

## 2024-01-02 MED ORDER — AMPHETAMINE-DEXTROAMPHET ER 15 MG PO CP24: 15.0000 mg | ORAL_CAPSULE | Freq: Two times a day (BID) | ORAL | 0 refills | Status: DC

## 2024-01-02 NOTE — Telephone Encounter (Signed)
 Copied from CRM #[id number]. Topic: Clinical - Medication Refill >> Jan 02, 2024 10:27 AM Merlynn A wrote: Medication: amphetamine -dextroamphetamine  (ADDERALL XR) 15 MG 24 hr capsule, amphetamine -dextroamphetamine  (ADDERALL) 5 MG tablet, escitalopram  (LEXAPRO ) 10 MG tablet, escitalopram  (LEXAPRO ) 20 MG tablet   Has the patient contacted their pharmacy? Yes (Agent: If no, request that the patient contact the pharmacy for the refill. If patient does not wish to contact the pharmacy document the reason why and proceed with request.) (Agent: If yes, when and what did the pharmacy advise?)  Patient does not have any refills available.   This is the patient's preferred pharmacy:  [id number] [place], [place] - [address] [place] [address] Phone: [phone] Fax: [phone]  Is this the correct pharmacy for this prescription? Yes If no, delete pharmacy and type the correct one.   Has the prescription been filled recently? No  Is the patient out of the medication? No - Adderall 15mg  out of medication  Has the patient been seen for an appointment in the last year OR does the patient have an upcoming appointment? Yes  Can we respond through MyChart? Yes  Agent: Please be advised that Rx refills may take up to 3 business days. We ask that you follow-up with your pharmacy.

## 2024-01-22 ENCOUNTER — Ambulatory Visit: Payer: Self-pay

## 2024-01-22 ENCOUNTER — Encounter: Payer: Self-pay | Admitting: Oncology

## 2024-01-22 DIAGNOSIS — R109 Unspecified abdominal pain: Secondary | ICD-10-CM | POA: Diagnosis not present

## 2024-01-22 DIAGNOSIS — K259 Gastric ulcer, unspecified as acute or chronic, without hemorrhage or perforation: Secondary | ICD-10-CM | POA: Diagnosis not present

## 2024-01-22 DIAGNOSIS — D509 Iron deficiency anemia, unspecified: Secondary | ICD-10-CM | POA: Diagnosis present

## 2024-01-22 DIAGNOSIS — K2960 Other gastritis without bleeding: Secondary | ICD-10-CM | POA: Diagnosis not present

## 2024-01-22 DIAGNOSIS — K640 First degree hemorrhoids: Secondary | ICD-10-CM | POA: Diagnosis not present

## 2024-01-22 DIAGNOSIS — K269 Duodenal ulcer, unspecified as acute or chronic, without hemorrhage or perforation: Secondary | ICD-10-CM | POA: Diagnosis not present

## 2024-01-22 DIAGNOSIS — K5909 Other constipation: Secondary | ICD-10-CM | POA: Diagnosis not present

## 2024-01-22 DIAGNOSIS — R140 Abdominal distension (gaseous): Secondary | ICD-10-CM | POA: Diagnosis not present

## 2024-02-08 ENCOUNTER — Other Ambulatory Visit: Payer: Self-pay | Admitting: Student

## 2024-02-08 DIAGNOSIS — F909 Attention-deficit hyperactivity disorder, unspecified type: Secondary | ICD-10-CM

## 2024-02-08 NOTE — Telephone Encounter (Unsigned)
 Copied from CRM [id number]. Topic: Clinical - Medication Refill >> Feb 08, 2024 12:43 PM Fredrica W wrote: Medication:  amphetamine -dextroamphetamine  (ADDERALL) 5 MG tablet amphetamine -dextroamphetamine  (ADDERALL XR) 15 MG 24 hr capsule  Has the patient contacted their pharmacy? No (Agent: If no, request that the patient contact the pharmacy for the refill. If patient does not wish to contact the pharmacy document the reason why and proceed with request.) (Agent: If yes, when and what did the pharmacy advise?) Calls monthly   This is the patient's preferred pharmacy:  [id number] [place], [place] - [address] [place] [address] Phone: [phone] Fax: [phone]  Is this the correct pharmacy for this prescription? Yes If no, delete pharmacy and type the correct one.   Has the prescription been filled recently? Yes  Is the patient out of the medication? Yes  Has the patient been seen for an appointment in the last year OR does the patient have an upcoming appointment? Yes  Can we respond through MyChart? No  Agent: Please be advised that Rx refills may take up to 3 business days. We ask that you follow-up with your pharmacy.

## 2024-02-08 NOTE — Telephone Encounter (Signed)
 LOV 11/24/23

## 2024-02-09 MED ORDER — AMPHETAMINE-DEXTROAMPHET ER 15 MG PO CP24: 15.0000 mg | ORAL_CAPSULE | Freq: Two times a day (BID) | ORAL | 0 refills | Status: DC

## 2024-02-09 MED ORDER — AMPHETAMINE-DEXTROAMPHETAMINE 5 MG PO TABS: 5.0000 mg | ORAL_TABLET | Freq: Every day | ORAL | 0 refills | Status: DC

## 2024-03-07 ENCOUNTER — Inpatient Hospital Stay: Attending: Oncology

## 2024-03-07 DIAGNOSIS — D508 Other iron deficiency anemias: Secondary | ICD-10-CM

## 2024-03-07 LAB — IRON AND TIBC
Iron: 88 ug/dL (ref 28–170)
Saturation Ratios: 24 % (ref 10.4–31.8)
TIBC: 358 ug/dL (ref 250–450)
UIBC: 271 ug/dL

## 2024-03-07 LAB — CBC (CANCER CENTER ONLY)
HCT: 36.2 % (ref 36.0–46.0)
Hemoglobin: 12.2 g/dL (ref 12.0–15.0)
MCH: 29.8 pg (ref 26.0–34.0)
MCHC: 33.7 g/dL (ref 30.0–36.0)
MCV: 88.5 fL (ref 80.0–100.0)
Platelet Count: 430 K/uL — ABNORMAL HIGH (ref 150–400)
RBC: 4.09 MIL/uL (ref 3.87–5.11)
RDW: 12.1 % (ref 11.5–15.5)
WBC Count: 6.5 K/uL (ref 4.0–10.5)
nRBC: 0 % (ref 0.0–0.2)

## 2024-03-07 LAB — VITAMIN B12: Vitamin B-12: 808 pg/mL (ref 180–914)

## 2024-03-07 LAB — FERRITIN: Ferritin: 115 ng/mL (ref 11–307)

## 2024-03-12 ENCOUNTER — Other Ambulatory Visit: Payer: Self-pay | Admitting: Student

## 2024-03-12 DIAGNOSIS — F909 Attention-deficit hyperactivity disorder, unspecified type: Secondary | ICD-10-CM

## 2024-03-12 NOTE — Telephone Encounter (Signed)
 Copied from CRM [id number]. Topic: Clinical - Medication Refill >> Mar 12, 2024 12:34 PM [name] wrote: Medication: amphetamine -dextroamphetamine  (ADDERALL) 5 MG tablet amphetamine -dextroamphetamine  (ADDERALL XR) 15 MG 24 hr capsule   Has the patient contacted their pharmacy? Yes (Agent: If no, request that the patient contact the pharmacy for the refill. If patient does not wish to contact the pharmacy document the reason why and proceed with request.) (Agent: If yes, when and what did the pharmacy advise?)  This is the patient's preferred pharmacy:  [id number] [place], [place] - [address] [address] Phone: [phone] Fax: [phone]  Is this the correct pharmacy for this prescription? Yes If no, delete pharmacy and type the correct one.   Has the prescription been filled recently? No  Is the patient out of the medication? Will be running out of both during thanksgiving when everything is closed so patient is putting the refill req now   Has the patient been seen for an appointment in the last year OR does the patient have an upcoming appointment? Yes  Can we respond through MyChart? Yes  Agent: Please be advised that Rx refills may take up to 3 business days. We ask that you follow-up with your pharmacy.

## 2024-03-13 ENCOUNTER — Encounter: Payer: Self-pay | Admitting: Oncology

## 2024-03-18 ENCOUNTER — Other Ambulatory Visit: Payer: Self-pay | Admitting: *Deleted

## 2024-03-18 DIAGNOSIS — F909 Attention-deficit hyperactivity disorder, unspecified type: Secondary | ICD-10-CM

## 2024-03-18 MED ORDER — AMPHETAMINE-DEXTROAMPHETAMINE 5 MG PO TABS: 5.0000 mg | ORAL_TABLET | Freq: Every day | ORAL | 0 refills | Status: AC

## 2024-03-18 MED ORDER — AMPHETAMINE-DEXTROAMPHET ER 15 MG PO CP24: 15.0000 mg | ORAL_CAPSULE | Freq: Two times a day (BID) | ORAL | 0 refills | Status: DC

## 2024-03-18 NOTE — Telephone Encounter (Signed)
 Copied from CRM [id number]. Topic: Clinical - Medication Refill >> Mar 12, 2024 12:34 PM [name] wrote: Medication: amphetamine -dextroamphetamine  (ADDERALL) 5 MG tablet amphetamine -dextroamphetamine  (ADDERALL XR) 15 MG 24 hr capsule   Has the patient contacted their pharmacy? Yes (Agent: If no, request that the patient contact the pharmacy for the refill. If patient does not wish to contact the pharmacy document the reason why and proceed with request.) (Agent: If yes, when and what did the pharmacy advise?)  This is the patient's preferred pharmacy:  [id number] [place], [place] - [address] [place] [address] Phone: [phone] Fax: [phone]  Is this the correct pharmacy for this prescription? Yes If no, delete pharmacy and type the correct one.   Has the prescription been filled recently? No  Is the patient out of the medication? Will be running out of both during thanksgiving when everything is closed so patient is putting the refill req now   Has the patient been seen for an appointment in the last year OR does the patient have an upcoming appointment? Yes  Can we respond through MyChart? Yes  Agent: Please be advised that Rx refills may take up to 3 business days. We ask that you follow-up with your pharmacy. >> Mar 18, 2024  2:26 PM [name] wrote: Pt. Guse is calling to inquire about the status of her medication:  Medication: amphetamine -dextroamphetamine  (ADDERALL) 5 MG tablet    amphetamine -dextroamphetamine  (ADDERALL XR) 15 MG 24 hr capsule      Please advise it has been over 72 hours. Patient would like a call back today.

## 2024-03-19 ENCOUNTER — Telehealth: Payer: Self-pay | Admitting: *Deleted

## 2024-03-19 NOTE — Telephone Encounter (Signed)
 Refill has been completed.           Copied from CRM [id number]. Topic: Clinical - Medication Refill >> Mar 12, 2024 12:34 PM [name] wrote: Medication: amphetamine -dextroamphetamine  (ADDERALL) 5 MG tablet amphetamine -dextroamphetamine  (ADDERALL XR) 15 MG 24 hr capsule   Has the patient contacted their pharmacy? Yes (Agent: If no, request that the patient contact the pharmacy for the refill. If patient does not wish to contact the pharmacy document the reason why and proceed with request.) (Agent: If yes, when and what did the pharmacy advise?)  This is the patient's preferred pharmacy:  [id number] [place], [place] - [name] [address] Phone: [phone] Fax: [phone]  Is this the correct pharmacy for this prescription? Yes If no, delete pharmacy and type the correct one.   Has the prescription been filled recently? No  Is the patient out of the medication? Will be running out of both during thanksgiving when everything is closed so patient is putting the refill req now   Has the patient been seen for an appointment in the last year OR does the patient have an upcoming appointment? Yes  Can we respond through MyChart? Yes  Agent: Please be advised that Rx refills may take up to 3 business days. We ask that you follow-up with your pharmacy. >> Mar 18, 2024  2:26 PM [name] wrote: Pt. Peggy Lewis is calling to inquire about the status of her medication:  Medication: amphetamine -dextroamphetamine  (ADDERALL) 5 MG tablet    amphetamine -dextroamphetamine  (ADDERALL XR) 15 MG 24 hr capsule      Please advise it has been over 72 hours. Patient would like a call back today.

## 2024-03-20 ENCOUNTER — Telehealth: Payer: Self-pay

## 2024-03-20 ENCOUNTER — Telehealth: Payer: Self-pay | Admitting: *Deleted

## 2024-03-20 NOTE — Telephone Encounter (Signed)
 Prior Authorization for patient (Adderall XR 15MG  er capsules) came through on cover my meds was submitted with last office notes awaiting approval or denial.  [id number]

## 2024-03-20 NOTE — Telephone Encounter (Signed)
 Copied from CRM [id number]. Topic: Clinical - Prescription Issue >> Mar 20, 2024 10:47 AM Suzette B wrote: Reason for CRM: amphetamine -dextroamphetamine  (ADDERALL XR) 15 MG 24 hr capsule, pharmacy advised pt she will need a PLA sent in through [email or web address] for [facility] She states that insurance is not wanting to cover unless this PLA is completed.   [phone] Peggy Lewis please call to advise

## 2024-03-20 NOTE — Telephone Encounter (Addendum)
 There are 2 active rx for adderall with different instructions on both rx. Can you clarify if the patient is supposed to be taking 15 mg, 5 mg or both?

## 2024-03-20 NOTE — Telephone Encounter (Signed)
 Prior Authorization for patient (Adderall 5MG  tablets) came through on cover my meds was submitted with last office notes awaiting approval or denial.  XZB:[id number]

## 2024-03-20 NOTE — Telephone Encounter (Signed)
 Peggy Lewis (Key: [id number]) Need Help? Call us  at [phone] Outcome Additional Information Required Drug is covered by current benefit plan. No further PA activity needed Drug Adderall 5MG  tablets ePA cloud Pension Scheme Manager PA Form [phone] NCPDP)  03/21/24 lvm for patient regarding the approval.

## 2024-03-21 NOTE — Telephone Encounter (Signed)
 Pa was submitted for both rx and was approved. I lvm for patient regarding the approval.

## 2024-03-21 NOTE — Telephone Encounter (Signed)
 Adlean Hardeman (Key: [id number]) PA Case ID #: [id number] Need Help? Call us  at [phone] Outcome Approved on December 3 by [facility] 2017 CaseId:[id number];Status:Approved;Review Type:Qty;Coverage Start Date:03/06/2024;Coverage End Date:03/20/2025; Effective Date: 03/06/2024 Authorization Expiration Date: 03/20/2025 Drug Adderall XR 15MG  er capsules ePA cloud Pension Scheme Manager PA Form [phone] NCPDP)  Lvm for patient regarding the approval.

## 2024-03-22 MED ORDER — AMPHETAMINE-DEXTROAMPHET ER 15 MG PO CP24: 15.0000 mg | ORAL_CAPSULE | Freq: Two times a day (BID) | ORAL | 0 refills | Status: DC

## 2024-03-22 MED ORDER — AMPHETAMINE-DEXTROAMPHETAMINE 5 MG PO TABS: 5.0000 mg | ORAL_TABLET | Freq: Every day | ORAL | 0 refills | Status: DC

## 2024-04-15 ENCOUNTER — Other Ambulatory Visit: Payer: Self-pay | Admitting: Student

## 2024-04-15 DIAGNOSIS — F909 Attention-deficit hyperactivity disorder, unspecified type: Secondary | ICD-10-CM

## 2024-04-15 MED ORDER — AMPHETAMINE-DEXTROAMPHET ER 15 MG PO CP24: 15.0000 mg | ORAL_CAPSULE | Freq: Two times a day (BID) | ORAL | 0 refills | Status: AC

## 2024-04-15 NOTE — Telephone Encounter (Signed)
 Copied from CRM [id number]. Topic: Clinical - Medication Refill >> Apr 15, 2024  2:46 PM Susanna ORN wrote: Medication: amphetamine -dextroamphetamine  (ADDERALL XR) 15 MG 24 hr capsule  Has the patient contacted their pharmacy? No (Agent: If no, request that the patient contact the pharmacy for the refill. If patient does not wish to contact the pharmacy document the reason why and proceed with request.) (Agent: If yes, when and what did the pharmacy advise?)  This is the patient's preferred pharmacy:  [id number] [place], [place] - [address] [place] [address] Phone: [phone] Fax: [phone]  Is this the correct pharmacy for this prescription? Yes If no, delete pharmacy and type the correct one.   Has the prescription been filled recently? Yes  Is the patient out of the medication? No , will be out in a few days.   Has the patient been seen for an appointment in the last year OR does the patient have an upcoming appointment? Yes  Can we respond through MyChart? Yes  Agent: Please be advised that Rx refills may take up to 3 business days. We ask that you follow-up with your pharmacy.

## 2024-04-16 ENCOUNTER — Other Ambulatory Visit: Payer: Self-pay | Admitting: Oncology

## 2024-04-16 DIAGNOSIS — E538 Deficiency of other specified B group vitamins: Secondary | ICD-10-CM

## 2024-05-06 ENCOUNTER — Encounter: Payer: Self-pay | Admitting: Oncology

## 2024-05-06 ENCOUNTER — Telehealth: Payer: Self-pay | Admitting: *Deleted

## 2024-05-06 NOTE — Telephone Encounter (Signed)
 Will forward to PCP.                        Copied from CRM [id number]. Topic: Clinical - Medication Refill >> May 06, 2024  1:24 PM Zane F wrote: Reason for CRM:   Prescription In Question: buprenorphine -naloxone  (SUBOXONE ) 8-2 mg SUBL SL tablet  Patient is looking to have the listed prescription refilled through the submission of a new prescription. The patient's insurance does end tomorrow 05/07/2024 but is in need her prescription. Patient cannot schedule an appointment at this present time due to not having insurance and not looking to pay out of pocket costs. Please contact the patient providing clarity on any help the office can provide in assisting the patient with getting her prescription resubmitted to her preferred pharmacy.  Preferred Pharmacy: [id number] [place], [place] - [address] [address] Phone: [phone] Fax: [phone] Hours: Not open 24 hours    Callback Number: [id number] >> May 06, 2024  1:38 PM Zane F wrote: RICK- Med Management appointment was scheduled after this message was sent  May 07 2024 10:15 AM - Office Visit [facility] - A Dept Of [place]. [facility] - [place], [place]

## 2024-05-07 ENCOUNTER — Ambulatory Visit: Admitting: Student

## 2024-05-07 ENCOUNTER — Ambulatory Visit: Payer: Self-pay

## 2024-05-07 ENCOUNTER — Other Ambulatory Visit: Payer: Self-pay | Admitting: Student

## 2024-05-07 VITALS — BP 121/73 | HR 84 | Temp 97.5°F

## 2024-05-07 DIAGNOSIS — F909 Attention-deficit hyperactivity disorder, unspecified type: Secondary | ICD-10-CM

## 2024-05-07 DIAGNOSIS — F1121 Opioid dependence, in remission: Secondary | ICD-10-CM

## 2024-05-07 DIAGNOSIS — F988 Other specified behavioral and emotional disorders with onset usually occurring in childhood and adolescence: Secondary | ICD-10-CM

## 2024-05-07 MED ORDER — BUPRENORPHINE HCL-NALOXONE HCL 8-2 MG SL SUBL: SUBLINGUAL_TABLET | SUBLINGUAL | 0 refills | Status: AC

## 2024-05-07 NOTE — Assessment & Plan Note (Signed)
 Chronic and stable on her regimen of 15 mg BID with 5 mg prn in the evening. No adverse effects reported today.  Plan: -Contract signed today -Toxassure today -Continue regimen of: Adderall 15 mg XR BID and Adderall IR 5 mg daily prn (afternoon) with 2nd 15 mg dose afternoon/evening

## 2024-05-07 NOTE — Assessment & Plan Note (Signed)
 Patient presents with a history of opoid use disorder on suboxone  8-2 mg: one tablet in the morning, 1/2 tablet in the afternoon, and one tablet in the evening. Patient denies adverse effects including nausea, vomiting, constipation. They deny relapses or cravings.  Plan: -Contract UTD -Toxassure ordered, prior toxassure was collected in August 2025 and was appropriate  -Bowel regimen:  Miralax daily prn

## 2024-05-07 NOTE — Patient Instructions (Signed)
 Thank you, [name] for allowing us  to provide your care today. Today we discussed opioid use disorder and ADHD.  Let us  know when you need additional refills!     I have ordered the following medication/changed the following medications:   Stop the following medications: Medications Discontinued During This Encounter  Medication Reason   buprenorphine -naloxone  (SUBOXONE ) 8-2 mg SUBL SL tablet Reorder     Start the following medications: Meds ordered this encounter  Medications   buprenorphine -naloxone  (SUBOXONE ) 8-2 mg SUBL SL tablet    Sig: PLACE ONE TABLET UNDER THE TONGUE EVERY MORNING, PLACE ONE-HALF TABLET UNDER THE TONGUE EVERY AFTERNOON, AND PLACE ONE TABLET UNDER THE TONGUE EVERY EVENING70    Dispense:  70 tablet    Refill:  0    Ok to fill 6 days early due to insurance lapse after today     Follow up: 3 months    Remember:   Should you have any questions or concerns please call the internal medicine clinic at [phone].     Please note that our late policy has changed.  If you are more than 15 minutes late to your appointment, you may be asked to reschedule your appointment.  Dr. Kandis, D.O. [facility]

## 2024-05-07 NOTE — Progress Notes (Signed)
 "  Established Patient Office Visit  Subjective   Patient ID: Peggy Lewis, female    DOB: [birth year]  Age: 34 y.o. MRN: [id number]  No chief complaint on file.   Peggy Lewis is a 34 y.o. who presents to the clinic for OUD and ADHD. Of note, patient will experience a lapse in insurance for 30 days starting today. Please see problem based assessment and plan for additional details.   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   Benign mole 03/28/2023   Pure hypercholesterolemia 12/13/2022   ADD (attention deficit disorder) 12/06/2021   GERD (gastroesophageal reflux disease) 12/06/2021   History of alcohol abuse 04/20/2021   Opioid use disorder, moderate, in sustained remission, on maintenance therapy (HCC) 04/20/2021   Anxiety and depression 11/19/2013     Objective:     BP 121/73 (BP Location: Right Arm, Patient Position: Sitting, Cuff Size: Normal)   Pulse 84   Temp (!) 97.5 F (36.4 C) (Oral)   SpO2 99%  BP Readings from Last 3 Encounters:  05/07/24 121/73  12/28/23 108/69  12/22/23 108/64   Wt Readings from Last 3 Encounters:  12/06/23 144 lb 14.4 oz (65.7 kg)  11/24/23 144 lb 6.4 oz (65.5 kg)  09/26/23 149 lb 11.2 oz (67.9 kg)      Physical Exam Vitals reviewed.  Constitutional:      General: She is not in acute distress.    Appearance: She is not ill-appearing, toxic-appearing or diaphoretic.  Cardiovascular:     Rate and Rhythm: Normal rate and regular rhythm.  Pulmonary:     Effort: Pulmonary effort is normal.  Skin:    General: Skin is warm and dry.  Neurological:     Mental Status: She is alert.  Psychiatric:        Mood and Affect: Mood and affect normal.        Behavior: Behavior normal.    No results found for any visits on 05/07/24.  Last metabolic panel Lab Results   Component Value Date   GLUCOSE 89 09/18/2023   NA 136 09/18/2023   K 3.2 (L) 09/18/2023   CL 101 09/18/2023   CO2 27 09/18/2023   BUN 11 09/18/2023   CREATININE 0.72 09/18/2023   GFRNONAA >60 09/18/2023   CALCIUM 8.3 (L) 09/18/2023   PROT 6.3 (L) 09/18/2023   ALBUMIN 3.5 09/18/2023   BILITOT 0.2 09/18/2023   ALKPHOS 41 09/18/2023   AST 31 09/18/2023   ALT 25 09/18/2023   ANIONGAP 8 09/18/2023   Last lipids Lab Results  Component Value Date   CHOL 186 06/02/2022   HDL 66 06/02/2022   LDLCALC 105 (H) 06/02/2022   TRIG 67 06/02/2022   CHOLHDL 2.8 06/02/2022   Last hemoglobin A1c No results found for: HGBA1C    The ASCVD Risk score (Arnett DK, et al., 2019) failed to calculate for the following reasons:   The 2019 ASCVD risk score is only valid for ages 35 to 36   Risk score cannot be calculated because patient has a medical history suggesting prior/existing ASCVD   * - Cholesterol units were assumed    Assessment & Plan:   Problem List Items Addressed This Visit       Other   Opioid use disorder, moderate, in sustained remission, on maintenance therapy (HCC) (Chronic)  Patient presents with a history of opoid use disorder on suboxone  8-2 mg: one tablet in the morning, 1/2 tablet in the afternoon, and one tablet in the evening. Patient denies adverse effects including nausea, vomiting, constipation. They deny relapses or cravings.  Plan: -Contract UTD -Toxassure ordered, prior toxassure was collected in August 2025 and was appropriate  -Bowel regimen:  Miralax daily prn      Relevant Medications   buprenorphine -naloxone  (SUBOXONE ) 8-2 mg SUBL SL tablet   Other Relevant Orders   ToxAssure Select,+Antidepr,UR   ToxAssure Select,+Antidepr,UR   ADD (attention deficit disorder) - Primary (Chronic)   Chronic and stable on her regimen of 15 mg BID with 5 mg prn in the evening. No adverse effects reported today.  Plan: -Contract signed today -Toxassure  today -Continue regimen of: Adderall 15 mg XR BID and Adderall IR 5 mg daily prn (afternoon) with 2nd 15 mg dose afternoon/evening       Return in about 3 months (around 08/05/2024) for OUD.    Damien Lease, DO  "

## 2024-05-08 NOTE — Progress Notes (Signed)
 Internal Medicine Clinic Attending  Case discussed with the resident at the time of the visit.  We reviewed the residents history and exam and pertinent patient test results.  I agree with the assessment, diagnosis, and plan of care documented in the residents note.    I am okay with a one-time early refill of suboxone  since patient will have a lapse in insurance for the next few weeks before her work-based insurance kicks in

## 2024-05-10 NOTE — Addendum Note (Signed)
 Addended by: ANTONE DWAYNE SAILOR on: 05/10/2024 12:14 PM   Modules accepted: Orders

## 2024-05-14 ENCOUNTER — Ambulatory Visit: Payer: Self-pay | Admitting: Student

## 2024-05-14 LAB — TOXASSURE SELECT,+ANTIDEPR,UR

## 2024-05-21 ENCOUNTER — Other Ambulatory Visit: Payer: Self-pay | Admitting: Student

## 2024-05-21 ENCOUNTER — Telehealth: Payer: Self-pay | Admitting: Student

## 2024-05-21 DIAGNOSIS — F909 Attention-deficit hyperactivity disorder, unspecified type: Secondary | ICD-10-CM

## 2024-05-21 MED ORDER — AMPHETAMINE-DEXTROAMPHETAMINE 5 MG PO TABS: 5.0000 mg | ORAL_TABLET | Freq: Every day | ORAL | 0 refills | Status: AC

## 2024-05-21 MED ORDER — AMPHETAMINE-DEXTROAMPHET ER 15 MG PO CP24: 15.0000 mg | ORAL_CAPSULE | Freq: Two times a day (BID) | ORAL | 0 refills | Status: AC

## 2024-05-21 NOTE — Telephone Encounter (Signed)
 Last prescription 05/07/2024.  Last appointment 05/07/2024.  ToxAssure 05/07/2024 WNL.  No future appointments.

## 2024-05-27 ENCOUNTER — Ambulatory Visit: Admitting: Student

## 2024-06-07 ENCOUNTER — Other Ambulatory Visit

## 2024-06-07 ENCOUNTER — Ambulatory Visit: Admitting: Oncology
